# Patient Record
Sex: Female | Born: 1965
Health system: Southern US, Community
[De-identification: ages and names within clinical notes are randomized; demographics above are authoritative.]

## PROBLEM LIST (undated history)

## (undated) DIAGNOSIS — T7840XA Allergy, unspecified, initial encounter: Secondary | ICD-10-CM

## (undated) DIAGNOSIS — A630 Anogenital (venereal) warts: Secondary | ICD-10-CM

## (undated) DIAGNOSIS — F419 Anxiety disorder, unspecified: Secondary | ICD-10-CM

## (undated) DIAGNOSIS — Z9189 Other specified personal risk factors, not elsewhere classified: Secondary | ICD-10-CM

## (undated) DIAGNOSIS — O139 Gestational [pregnancy-induced] hypertension without significant proteinuria, unspecified trimester: Secondary | ICD-10-CM

## (undated) DIAGNOSIS — K279 Peptic ulcer, site unspecified, unspecified as acute or chronic, without hemorrhage or perforation: Secondary | ICD-10-CM

## (undated) HISTORY — PX: SPINE SURGERY: SHX786

## (undated) HISTORY — DX: Gestational (pregnancy-induced) hypertension without significant proteinuria, unspecified trimester: O13.9

## (undated) HISTORY — DX: Other specified personal risk factors, not elsewhere classified: Z91.89

## (undated) HISTORY — DX: Anogenital (venereal) warts: A63.0

## (undated) HISTORY — DX: Allergy, unspecified, initial encounter: T78.40XA

## (undated) HISTORY — DX: Peptic ulcer, site unspecified, unspecified as acute or chronic, without hemorrhage or perforation: K27.9

## (undated) HISTORY — DX: Anxiety disorder, unspecified: F41.9

---

## 1998-08-23 ENCOUNTER — Other Ambulatory Visit: Admission: RE | Admit: 1998-08-23 | Discharge: 1998-08-23 | Payer: Self-pay | Admitting: Obstetrics and Gynecology

## 1998-10-18 ENCOUNTER — Encounter (HOSPITAL_COMMUNITY): Admission: RE | Admit: 1998-10-18 | Discharge: 1999-01-16 | Payer: Self-pay | Admitting: Obstetrics and Gynecology

## 1999-05-27 ENCOUNTER — Encounter: Payer: Self-pay | Admitting: Obstetrics and Gynecology

## 1999-05-27 ENCOUNTER — Ambulatory Visit (HOSPITAL_COMMUNITY): Admission: RE | Admit: 1999-05-27 | Discharge: 1999-05-27 | Payer: Self-pay | Admitting: Obstetrics and Gynecology

## 1999-10-20 ENCOUNTER — Inpatient Hospital Stay (HOSPITAL_COMMUNITY): Admission: AD | Admit: 1999-10-20 | Discharge: 1999-10-22 | Payer: Self-pay | Admitting: Obstetrics and Gynecology

## 1999-12-05 ENCOUNTER — Other Ambulatory Visit: Admission: RE | Admit: 1999-12-05 | Discharge: 1999-12-05 | Payer: Self-pay | Admitting: Obstetrics and Gynecology

## 2001-05-28 ENCOUNTER — Other Ambulatory Visit: Admission: RE | Admit: 2001-05-28 | Discharge: 2001-05-28 | Payer: Self-pay | Admitting: Obstetrics and Gynecology

## 2001-07-11 ENCOUNTER — Encounter: Payer: Self-pay | Admitting: Obstetrics and Gynecology

## 2001-07-11 ENCOUNTER — Ambulatory Visit (HOSPITAL_COMMUNITY): Admission: RE | Admit: 2001-07-11 | Discharge: 2001-07-11 | Payer: Self-pay | Admitting: Family Medicine

## 2001-11-27 ENCOUNTER — Inpatient Hospital Stay (HOSPITAL_COMMUNITY): Admission: AD | Admit: 2001-11-27 | Discharge: 2001-11-29 | Payer: Self-pay | Admitting: Obstetrics and Gynecology

## 2002-06-18 ENCOUNTER — Other Ambulatory Visit: Admission: RE | Admit: 2002-06-18 | Discharge: 2002-06-18 | Payer: Self-pay | Admitting: Obstetrics and Gynecology

## 2003-06-25 ENCOUNTER — Other Ambulatory Visit: Admission: RE | Admit: 2003-06-25 | Discharge: 2003-06-25 | Payer: Self-pay | Admitting: Obstetrics and Gynecology

## 2004-01-10 HISTORY — PX: OTHER SURGICAL HISTORY: SHX169

## 2004-06-23 ENCOUNTER — Other Ambulatory Visit: Admission: RE | Admit: 2004-06-23 | Discharge: 2004-06-23 | Payer: Self-pay | Admitting: Obstetrics and Gynecology

## 2004-07-28 ENCOUNTER — Ambulatory Visit (HOSPITAL_BASED_OUTPATIENT_CLINIC_OR_DEPARTMENT_OTHER): Admission: RE | Admit: 2004-07-28 | Discharge: 2004-07-28 | Payer: Self-pay | Admitting: General Surgery

## 2004-07-28 ENCOUNTER — Ambulatory Visit (HOSPITAL_COMMUNITY): Admission: RE | Admit: 2004-07-28 | Discharge: 2004-07-28 | Payer: Self-pay | Admitting: General Surgery

## 2004-07-28 ENCOUNTER — Encounter (INDEPENDENT_AMBULATORY_CARE_PROVIDER_SITE_OTHER): Payer: Self-pay | Admitting: *Deleted

## 2005-09-21 ENCOUNTER — Other Ambulatory Visit: Admission: RE | Admit: 2005-09-21 | Discharge: 2005-09-21 | Payer: Self-pay | Admitting: Obstetrics and Gynecology

## 2008-07-15 ENCOUNTER — Ambulatory Visit: Payer: Self-pay | Admitting: Internal Medicine

## 2008-07-15 ENCOUNTER — Encounter (INDEPENDENT_AMBULATORY_CARE_PROVIDER_SITE_OTHER): Payer: Self-pay | Admitting: *Deleted

## 2008-07-15 DIAGNOSIS — A630 Anogenital (venereal) warts: Secondary | ICD-10-CM | POA: Insufficient documentation

## 2008-07-15 DIAGNOSIS — Z9189 Other specified personal risk factors, not elsewhere classified: Secondary | ICD-10-CM | POA: Insufficient documentation

## 2008-07-15 DIAGNOSIS — K279 Peptic ulcer, site unspecified, unspecified as acute or chronic, without hemorrhage or perforation: Secondary | ICD-10-CM | POA: Insufficient documentation

## 2008-07-15 DIAGNOSIS — R030 Elevated blood-pressure reading, without diagnosis of hypertension: Secondary | ICD-10-CM | POA: Insufficient documentation

## 2008-07-15 LAB — CONVERTED CEMR LAB
ALT: 14 units/L (ref 0–35)
AST: 21 units/L (ref 0–37)
Alkaline Phosphatase: 49 units/L (ref 39–117)
BUN: 9 mg/dL (ref 6–23)
Basophils Relative: 0.5 % (ref 0.0–3.0)
Bilirubin, Direct: 0.1 mg/dL (ref 0.0–0.3)
Chloride: 105 meq/L (ref 96–112)
Cholesterol: 178 mg/dL (ref 0–200)
Creatinine, Ser: 0.8 mg/dL (ref 0.4–1.2)
Eosinophils Relative: 1.6 % (ref 0.0–5.0)
GFR calc non Af Amer: 84.2 mL/min (ref >60)
LDL Cholesterol: 96 mg/dL (ref 0–99)
Lymphocytes Relative: 14.4 % (ref 12.0–46.0)
Monocytes Relative: 4.7 % (ref 3.0–12.0)
Neutrophils Relative %: 78.8 % — ABNORMAL HIGH (ref 43.0–77.0)
Pap Smear: NORMAL
Platelets: 144 10*3/uL — ABNORMAL LOW (ref 150.0–400.0)
Potassium: 3.8 meq/L (ref 3.5–5.1)
RBC: 4.12 M/uL (ref 3.87–5.11)
Total Bilirubin: 0.7 mg/dL (ref 0.3–1.2)
Total CHOL/HDL Ratio: 2
Total Protein: 7.5 g/dL (ref 6.0–8.3)
VLDL: 9.4 mg/dL (ref 0.0–40.0)
WBC: 6.2 10*3/uL (ref 4.5–10.5)

## 2008-07-17 ENCOUNTER — Encounter (INDEPENDENT_AMBULATORY_CARE_PROVIDER_SITE_OTHER): Payer: Self-pay | Admitting: *Deleted

## 2008-07-17 LAB — CONVERTED CEMR LAB: Vit D, 25-Hydroxy: 28 ng/mL — ABNORMAL LOW (ref 30–89)

## 2008-08-06 ENCOUNTER — Encounter: Admission: RE | Admit: 2008-08-06 | Discharge: 2008-08-06 | Payer: Self-pay | Admitting: Obstetrics and Gynecology

## 2008-08-12 ENCOUNTER — Encounter: Admission: RE | Admit: 2008-08-12 | Discharge: 2008-08-12 | Payer: Self-pay | Admitting: Obstetrics and Gynecology

## 2008-09-29 ENCOUNTER — Ambulatory Visit: Payer: Self-pay | Admitting: Internal Medicine

## 2009-03-03 ENCOUNTER — Encounter: Admission: RE | Admit: 2009-03-03 | Discharge: 2009-03-03 | Payer: Self-pay | Admitting: Obstetrics and Gynecology

## 2010-05-27 NOTE — Discharge Summary (Signed)
[facility]  Patient:    Jill Gay, Jill Gay                  MRN: [id number] Adm. Date:  [id number] Disc. Date: [id number] Attending:  Oliver Pila                           Discharge Summary                                This is a 45 year old white female para 0-0-1-0, gravida 2 at 60 1/[redacted] weeks gestation, [facility] October 26, 1999 by last period and first trimester sonogram.  Presented for induction of labor given borderline blood pressure.  No symptoms or signs of PIH.  Favorable cervix.  Office blood pressure 130-160s/90s.  Negative proteinuria.  PRENATAL LABORATORIES:        Blood group and type O+ with negative antibody. RPR nonreactive.  Rubella immune.  Hepatitis B surface antigen negative.  HIV negative.  GC and chlamydia negative.  Group B strep negative.  One hour glucola 83.  PAST OBSTETRICAL HISTORY:     In October 2000 spontaneous abortion.  PAST GYNECOLOGICAL HISTORY:   History of cryo surgery 10-15 years ago.  PAST MEDICAL HISTORY:         Remote history of ulcers.  PAST SURGICAL HISTORY:        Negative.  ALLERGIES:                    No known drug allergies.  MEDICATIONS:                  Prenatal vitamins.  PHYSICAL EXAMINATION:  VITAL SIGNS:                  On admission blood pressure 135/89.  Vital signs otherwise normal.  Fetal heart tones were reactive.  There were no contractions.  HEART:                        Normal size and sounds.  No murmurs.  LUNGS:                        Clear.  ABDOMEN:                      Soft and nontender.  Cervix was 1 cm, 90% effaced, vertex at a -1 station.                                Artificial rupture of the membranes produced clear fluid.  The patient was begun on Pitocin.  She received an epidural.  By 1 p.m. she was 9 cm dilated.  By 3 p.m. she had been pushing since 2 p.m.  She pushed for two hours and 45 minutes and progressed to +2 station in the LOA position.   Because of maternal exhaustion and little progress for one hour, low forceps delivery was done by Dr. Senaida Ores.  The baby delivered in two pulls over a second degree laceration.  Apgars were 8 and 9 at one and five minutes.  Weight 7 pounds 7 ounces.  Placenta delivered intact.  Three vessel cord.  Left sulcus laceration repaired with 2-0 Vicryl and the second  degree perineal laceration repaired with 2-0 Vicryl.  Cervix and rectum were intact. Blood loss about 450 cc.  Postpartum the patient did quite well and was discharged on the second postpartum day.  PIH laboratories done on admission were completely normal.  Uric acid was 4.0, LDH 192, SGOT 23, SGPT 16, BUN 9. On admission the RPR was nonreactive.  Hemoglobin 12.5, hematocrit 36.3, white count 11,400, platelet count 161,000.  Follow-up hemoglobin 10.7, hematocrit 30.0, platelet count 169,000.  FINAL DIAGNOSES:              1. Intrauterine pregnancy 39 weeks, delivered,                                  LOA.                               2. Pregnancy induced high blood pressure.  OPERATION:                    Low forceps vaginal delivery.  Repair of perineal and vaginal lacerations.  FINAL CONDITION:              Improved.  INSTRUCTIONS:                 Regular discharge instruction booklet.  Patient was given a prescription for Percocet 325/5 to take every four to six hours as needed for pain and Motrin 600 mg one every eight hours as needed.  She is to return in six weeks for follow-up examination.DD:  10/22/99 TD:  10/23/99 Job: [id number]

## 2010-05-27 NOTE — Op Note (Signed)
NAMECAMBER, NINH               ACCOUNT NO.:  [id number]   MEDICAL RECORD NO.:  [id number]          PATIENT TYPE:  AMB   LOCATION:  [facility]                         FACILITY:  [facility]   PHYSICIAN:  Adolph Pollack, M.D.DATE OF BIRTH:  May 11, 1965   DATE OF PROCEDURE:  07/28/2004  DATE OF DISCHARGE:                                 OPERATIVE REPORT   PREOPERATIVE DIAGNOSIS:  Right groin soft tissue mass.   POSTOPERATIVE DIAGNOSIS:  Right groin soft tissue mass (2 cm).   PROCEDURE:  Excision of right groin soft tissue mass.   SURGEON:  Adolph Pollack, M.D.   ANESTHESIA:  MAC with local (1% lidocaine with epinephrine plus 0.5% plain  Marcaine).   INDICATIONS:  Ms. Iglehart is a 45 year old female whose noted a mass in her  right groin that has been slowly growing over time. It causes her  intermittent discomfort. She now presents for excision of it. The procedure  and the risks were discussed with her preoperatively.   TECHNIQUE:  She is seen in the holding area and the right groin marked with  my initials. She is then brought to the operating room, placed supine on the  operating table and given some intravenous sedation. A circle is then placed  around the right groin mass and the right groin area was sterilely prepped  and draped. Local anesthetic was infiltrated directly over the mass in the  right groin area. An incision was made directly over the mass through the  skin and subcutaneous tissue. The mass was deep to Scarpa's fascia which was  divided. I palpated a soft tissue mass which appeared to be fairly firm. It  was slightly fixed to the external oblique aponeurosis but was able to be  dissected away from this fairly easily bluntly. The mass was then excised  and sent to pathology.   I placed a 2-0 Prolene suture directly at the area where the mass was  attached to the external oblique aponeurosis just in case I needed to re-  explore this area further. I subsequently  injected local anesthetic into the  bed of the excision cavity. The subcutaneous tissue was then closed with  running 4-0 Monocryl suture and the skin was closed  with a running 4-0 Monocryl subcuticular stitch. Steri-Strips and sterile  dressing were applied. She tolerated the procedure without any apparent  complications and was taken to recovery in satisfactory condition. She will  be given postop instructions and follow-up in about two weeks.       TJR/MEDQ  D:  07/28/2004  T:  07/28/2004  Job:  [id number]   cc:   Huel Cote, M.D.  [address] [place], [address]  [place], [place] [address]  Fax: [phone]

## 2010-05-27 NOTE — Discharge Summary (Signed)
Jill Gay, Jill Gay                           ACCOUNT NO.:  [id number]   MEDICAL RECORD NO.:  [id number]                   PATIENT TYPE:  INP   LOCATION:  9133                                 FACILITY:  [facility]   PHYSICIAN:  Huel Cote, M.D.              DATE OF BIRTH:  11-04-1965   DATE OF ADMISSION:  11/27/2001  DATE OF DISCHARGE:  11/29/2001                                 DISCHARGE SUMMARY   DISCHARGE DIAGNOSES:  1. Term pregnancy at 70 and 4/7 weeks delivered.  2. Status post normal spontaneous vaginal delivery.   DISCHARGE MEDICATIONS:  1. Motrin 600 mg p.o. q.6h.  2. Percocet one to two tablets p.o. q.4h. p.r.n.   DISCHARGE FOLLOWUP:  The patient is to follow up in six weeks for her  routine postpartum examination.   HOSPITAL COURSE:  The patient is a 45 year old G3, P1-0-1-1 who is admitted  at 89 and 4/7 weeks with a complaint of irregular contractions and bloody  show.  Prenatal care had been uncomplicated except for advanced cervical  dilation the last few weeks of pregnancy to 3-4 cm.   PRENATAL LABORATORIES:  O+.  Antibody negative.  RPR nonreactive.  Rubella  immune.  Hepatitis B surface antigen negative.  HIV declined.  GC negative.  Chlamydia negative.  Triple screen negative.  GBS negative.  One hour  Glucola 133.   PAST OB HISTORY:  The patient had a low forceps delivery 7 pound 7 ounce  infant in 2001.   PAST GYN HISTORY:  No abnormal Pap smears.   PAST MEDICAL HISTORY:  Remote history of ulcers.   PAST SURGICAL HISTORY:  None.   ALLERGIES:  None.   MEDICATIONS:  None.   PHYSICAL EXAMINATION:  VITAL SIGNS:  On admission she was afebrile with  stable vital signs.  Fetal heart rate was reactive.  ABDOMEN:  She was gravid and nontender with estimated fetal weight of 7.5-8  pounds.  PELVIC:  Cervix was 90% effaced, 5 cm dilated, and -1 station.   She had rupture of membranes performed with clear fluid obtained and was  augmented with Pitocin and  reached complete dilation over the next several  hours.  Pushed well with a normal spontaneous vaginal delivery of a vigorous  female infant over a first degree vaginal laceration.  Apgars were 8, 8, and  9.  Weight was 7 pounds 10 ounces.  Placenta delivered spontaneously.  There  was a first degree laceration repaired with 2-0 Vicryl for  hemostasis.  Estimated blood loss was 350 cc.  The patient did very well  postpartum and on postpartum day number two was afebrile with stable vital  signs and no complaints.  Therefore, she was felt stable for discharge home  and was discharged with medications and follow-up as previously stated.  Huel Cote, M.D.    KR/MEDQ  D:  11/29/2001  T:  11/29/2001  Job:  [id number]

## 2010-11-02 ENCOUNTER — Telehealth: Payer: Self-pay | Admitting: *Deleted

## 2010-11-02 DIAGNOSIS — Z0000 Encounter for general adult medical examination without abnormal findings: Secondary | ICD-10-CM

## 2010-11-02 NOTE — Telephone Encounter (Signed)
Received staff msg pt made cpx for 11/16/10. Need labs put in epic...11/02/10@4 :20pm/LMB

## 2010-11-09 ENCOUNTER — Encounter: Payer: Self-pay | Admitting: Internal Medicine

## 2010-11-16 ENCOUNTER — Ambulatory Visit (INDEPENDENT_AMBULATORY_CARE_PROVIDER_SITE_OTHER): Payer: Managed Care, Other (non HMO) | Admitting: Internal Medicine

## 2010-11-16 ENCOUNTER — Encounter: Payer: Self-pay | Admitting: Internal Medicine

## 2010-11-16 ENCOUNTER — Other Ambulatory Visit (INDEPENDENT_AMBULATORY_CARE_PROVIDER_SITE_OTHER): Payer: Managed Care, Other (non HMO)

## 2010-11-16 VITALS — BP 112/78 | HR 79 | Temp 98.0°F | Ht 66.0 in | Wt 132.0 lb

## 2010-11-16 DIAGNOSIS — Z23 Encounter for immunization: Secondary | ICD-10-CM

## 2010-11-16 DIAGNOSIS — Z0000 Encounter for general adult medical examination without abnormal findings: Secondary | ICD-10-CM

## 2010-11-16 LAB — HEPATIC FUNCTION PANEL
ALT: 11 U/L (ref 0–35)
Albumin: 4.8 g/dL (ref 3.5–5.2)
Alkaline Phosphatase: 52 U/L (ref 39–117)
Bilirubin, Direct: 0.2 mg/dL (ref 0.0–0.3)
Total Protein: 8 g/dL (ref 6.0–8.3)

## 2010-11-16 LAB — CBC WITH DIFFERENTIAL/PLATELET
Basophils Absolute: 0 10*3/uL (ref 0.0–0.1)
Basophils Relative: 0.5 % (ref 0.0–3.0)
Eosinophils Absolute: 0.1 10*3/uL (ref 0.0–0.7)
Hemoglobin: 13.7 g/dL (ref 12.0–15.0)
Lymphs Abs: 1 10*3/uL (ref 0.7–4.0)
MCHC: 34.6 g/dL (ref 30.0–36.0)
MCV: 96.4 fl (ref 78.0–100.0)
Monocytes Absolute: 0.3 10*3/uL (ref 0.1–1.0)
Neutro Abs: 3.3 10*3/uL (ref 1.4–7.7)
RBC: 4.12 Mil/uL (ref 3.87–5.11)
RDW: 12.2 % (ref 11.5–14.6)

## 2010-11-16 LAB — BASIC METABOLIC PANEL
CO2: 28 mEq/L (ref 19–32)
Chloride: 106 mEq/L (ref 96–112)
Glucose, Bld: 100 mg/dL — ABNORMAL HIGH (ref 70–99)
Sodium: 141 mEq/L (ref 135–145)

## 2010-11-16 LAB — URINALYSIS
Ketones, ur: NEGATIVE
Leukocytes, UA: NEGATIVE
Nitrite: NEGATIVE
Specific Gravity, Urine: 1.015 (ref 1.000–1.030)
Urobilinogen, UA: 0.2 (ref 0.0–1.0)
pH: 6.5 (ref 5.0–8.0)

## 2010-11-16 LAB — LIPID PANEL
HDL: 68.5 mg/dL (ref >39.00)
Total CHOL/HDL Ratio: 3
Triglycerides: 59 mg/dL (ref 0.0–149.0)

## 2010-11-16 NOTE — Patient Instructions (Addendum)
It was good to see you today. Exam and EKG look great! Keep up the good work! Test(s) ordered today. Your results will be called to you after review (48-72hours after test completion). If any changes need to be made, you will be notified at that time. Health Maintenance reviewed - tetanus booster with diptheria and pertussis given, everything else up to date. Please schedule followup annually for physical and labs, call sooner if problems.

## 2010-11-16 NOTE — Progress Notes (Signed)
Subjective:    Patient ID: Jill Gay, female    DOB: 06-01-1965, 45 y.o.   MRN: [id number]  HPI  patient is here today for annual physical. Patient feels well and has no complaints.  Past Medical History  Diagnosis Date  . CHICKENPOX, HX OF   . HYPERTENSION     "white coat"  . VENEREAL WART   . PUD (peptic ulcer disease)    Family History  Problem Relation Age of Onset  . Arthritis Other   . Alcohol abuse Other   . Heart disease Other     Grandparent  . Stroke Other     grandparent   History  Substance Use Topics  . Smoking status: Former Games developer  . Smokeless tobacco: Not on file  . Alcohol Use: No     Review of Systems Constitutional: Negative for fever or weight change.  Respiratory: Negative for cough and shortness of breath.   Cardiovascular: Negative for chest pain or palpitations.  Gastrointestinal: Negative for abdominal pain, no bowel changes.  Musculoskeletal: Negative for gait problem or joint swelling.  Skin: Negative for rash.  Neurological: Negative for dizziness or headache.  No other specific complaints in a complete review of systems (except as listed in HPI above).     Objective:   Physical Exam BP 112/78  Pulse 79  Temp(Src) 98 F (36.7 C) (Oral)  Ht 5\' 6"  (1.676 m)  Wt 132 lb (59.875 kg)  BMI 21.31 kg/m2  SpO2 98% Wt Readings from Last 3 Encounters:  11/16/10 132 lb (59.875 kg)  09/29/08 136 lb 9.6 oz (61.961 kg)  07/15/08 139 lb 6.4 oz (63.231 kg)   Constitutional: She appears well-developed and well-nourished. No distress.  HENT: Head: Normocephalic and atraumatic. Ears: B TMs ok, no erythema or effusion; Nose: Nose normal.  Mouth/Throat: Oropharynx is clear and moist. No oropharyngeal exudate.  Eyes: Conjunctivae and EOM are normal. Pupils are equal, round, and reactive to light. No scleral icterus.  Neck: Normal range of motion. Neck supple. No JVD present. No thyromegaly present.  Cardiovascular: Normal rate, regular rhythm  and normal heart sounds.  No murmur heard. No BLE edema. Pulmonary/Chest: Effort normal and breath sounds normal. No respiratory distress. She has no wheezes.  Abdominal: Soft. Bowel sounds are normal. She exhibits no distension. There is no tenderness. no masses Musculoskeletal: Normal range of motion, no joint effusions. No gross deformities Neurological: She is alert and oriented to person, place, and time. No cranial nerve deficit. Coordination normal.  Skin: Skin is warm and dry. No rash noted. No erythema.  Psychiatric: She has a normal mood and affect. Her behavior is normal. Judgment and thought content normal.   Lab Results  Component Value Date   WBC 6.2 07/15/2008   HGB 13.6 07/15/2008   HCT 39.3 07/15/2008   PLT 144.0* 07/15/2008   GLUCOSE 99 07/15/2008   CHOL 178 07/15/2008   TRIG 47.0 07/15/2008   HDL 72.60 07/15/2008   LDLCALC 96 07/15/2008   ALT 14 07/15/2008   AST 21 07/15/2008   NA 142 07/15/2008   K 3.8 07/15/2008   CL 105 07/15/2008   CREATININE 0.8 07/15/2008   BUN 9 07/15/2008   CO2 30 07/15/2008   TSH 2.24 07/15/2008    EKG: sinus @ 74 bpm, no ischemic changes     Assessment & Plan:  CPX - v70.0 - Patient has been counseled on age-appropriate routine health concerns for screening and prevention. These are reviewed and up-to-date. Immunizations are  up-to-date or declined. Labs and ECG reviewed.

## 2011-01-13 ENCOUNTER — Other Ambulatory Visit: Payer: Self-pay | Admitting: Obstetrics and Gynecology

## 2011-01-13 DIAGNOSIS — Z1231 Encounter for screening mammogram for malignant neoplasm of breast: Secondary | ICD-10-CM

## 2011-01-30 ENCOUNTER — Ambulatory Visit
Admission: RE | Admit: 2011-01-30 | Discharge: 2011-01-30 | Disposition: A | Discharge status: Home / Self Care | Payer: Commercial Indemnity | Source: Ambulatory Visit | Attending: Obstetrics and Gynecology | Admitting: Obstetrics and Gynecology

## 2011-01-30 DIAGNOSIS — Z1231 Encounter for screening mammogram for malignant neoplasm of breast: Secondary | ICD-10-CM

## 2013-07-30 ENCOUNTER — Other Ambulatory Visit: Payer: Self-pay

## 2013-07-30 DIAGNOSIS — Z1231 Encounter for screening mammogram for malignant neoplasm of breast: Secondary | ICD-10-CM

## 2013-08-13 ENCOUNTER — Ambulatory Visit
Admission: RE | Admit: 2013-08-13 | Discharge: 2013-08-13 | Disposition: A | Discharge status: Home / Self Care | Payer: 59 | Source: Ambulatory Visit

## 2013-08-13 DIAGNOSIS — Z1231 Encounter for screening mammogram for malignant neoplasm of breast: Secondary | ICD-10-CM

## 2013-10-08 ENCOUNTER — Other Ambulatory Visit (INDEPENDENT_AMBULATORY_CARE_PROVIDER_SITE_OTHER): Payer: 59

## 2013-10-08 ENCOUNTER — Encounter: Payer: Self-pay | Admitting: Internal Medicine

## 2013-10-08 ENCOUNTER — Ambulatory Visit (INDEPENDENT_AMBULATORY_CARE_PROVIDER_SITE_OTHER): Payer: 59 | Admitting: Internal Medicine

## 2013-10-08 VITALS — BP 118/72 | HR 66 | Temp 98.0°F | Resp 18 | Ht 64.0 in | Wt 133.0 lb

## 2013-10-08 DIAGNOSIS — Z13220 Encounter for screening for lipoid disorders: Secondary | ICD-10-CM

## 2013-10-08 DIAGNOSIS — Z23 Encounter for immunization: Secondary | ICD-10-CM

## 2013-10-08 DIAGNOSIS — R030 Elevated blood-pressure reading, without diagnosis of hypertension: Secondary | ICD-10-CM

## 2013-10-08 DIAGNOSIS — Z0000 Encounter for general adult medical examination without abnormal findings: Secondary | ICD-10-CM

## 2013-10-08 LAB — LIPID PANEL
CHOL/HDL RATIO: 3
Cholesterol: 196 mg/dL (ref 0–200)
HDL: 77.3 mg/dL (ref >39.00)
LDL CALC: 110 mg/dL — AB (ref 0–99)
NONHDL: 118.7
Triglycerides: 45 mg/dL (ref 0.0–149.0)
VLDL: 9 mg/dL (ref 0.0–40.0)

## 2013-10-08 LAB — BASIC METABOLIC PANEL
BUN: 15 mg/dL (ref 6–23)
CALCIUM: 9.6 mg/dL (ref 8.4–10.5)
CO2: 27 meq/L (ref 19–32)
CREATININE: 0.7 mg/dL (ref 0.4–1.2)
Chloride: 105 mEq/L (ref 96–112)
GFR: 89.88 mL/min (ref >60.00)
GLUCOSE: 97 mg/dL (ref 70–99)
Potassium: 4.4 mEq/L (ref 3.5–5.1)
SODIUM: 140 meq/L (ref 135–145)

## 2013-10-08 NOTE — Patient Instructions (Signed)
We will check your cholesterol and kidneys today to make sure they are still normal.  You are doing great!   Come back in about 1-2 years or sooner if you are sick or have new problems.  Exercise to Stay Healthy Exercise helps you become and stay healthy. EXERCISE IDEAS AND TIPS Choose exercises that:  You enjoy.  Fit into your day. You do not need to exercise really hard to be healthy. You can do exercises at a slow or medium level and stay healthy. You can:  Stretch before and after working out.  Try yoga, Pilates, or tai chi.  Lift weights.  Walk fast, swim, jog, run, climb stairs, bicycle, dance, or rollerskate.  Take aerobic classes. Exercises that burn about 150 calories:  Running 1  miles in 15 minutes.  Playing volleyball for 45 to 60 minutes.  Washing and waxing a car for 45 to 60 minutes.  Playing touch football for 45 minutes.  Walking 1  miles in 35 minutes.  Pushing a stroller 1  miles in 30 minutes.  Playing basketball for 30 minutes.  Raking leaves for 30 minutes.  Bicycling 5 miles in 30 minutes.  Walking 2 miles in 30 minutes.  Dancing for 30 minutes.  Shoveling snow for 15 minutes.  Swimming laps for 20 minutes.  Walking up stairs for 15 minutes.  Bicycling 4 miles in 15 minutes.  Gardening for 30 to 45 minutes.  Jumping rope for 15 minutes.  Washing windows or floors for 45 to 60 minutes. Document Released: 01/28/2010 Document Revised: 03/20/2011 Document Reviewed: 01/28/2010 [facility] Patient Information 2015 [place], [place]. This information is not intended to replace advice given to you by your health care provider. Make sure you discuss any questions you have with your health care provider.

## 2013-10-08 NOTE — Progress Notes (Signed)
Pre visit review using our clinic review tool, if applicable. No additional management support is needed unless otherwise documented below in the visit note. 

## 2013-10-09 DIAGNOSIS — Z0000 Encounter for general adult medical examination without abnormal findings: Secondary | ICD-10-CM | POA: Insufficient documentation

## 2013-10-09 NOTE — Assessment & Plan Note (Signed)
BP normal today and not on medication. Will continue to monitor.

## 2013-10-09 NOTE — Assessment & Plan Note (Signed)
Patient reports recent pap smear (within 1 year) normal. She will get flu shot.

## 2013-10-09 NOTE — Progress Notes (Signed)
   Subjective:    Patient ID: Jill Gay, female    DOB: [birth year], 48 y.o.   MRN: [id number]  HPI The patient is a 48 YO woman who is coming in today for routine care and insurance screening. She is not having any problems. She does not always exercise like she should but is trying to walk more. She is not taking any medicines and she tries to eat healthy although this is not always possible.   Review of Systems  Constitutional: Negative for fever, activity change, appetite change and fatigue.  HENT: Negative.   Eyes: Negative.   Respiratory: Negative for cough, chest tightness, shortness of breath and wheezing.   Cardiovascular: Negative for chest pain, palpitations and leg swelling.  Gastrointestinal: Negative for abdominal pain, diarrhea, constipation and abdominal distention.  Genitourinary: Negative.   Musculoskeletal: Negative for arthralgias, back pain, gait problem and myalgias.  Skin: Negative.   Allergic/Immunologic: Negative.   Neurological: Negative.   Psychiatric/Behavioral: Negative.       Objective:   Physical Exam  Constitutional: She is oriented to person, place, and time. She appears well-developed and well-nourished. No distress.  HENT:  Head: Normocephalic and atraumatic.  Eyes: EOM are normal.  Neck: Normal range of motion.  Cardiovascular: Normal rate and regular rhythm.   No murmur heard. Pulmonary/Chest: Effort normal and breath sounds normal. No respiratory distress. She has no wheezes. She has no rales.  Abdominal: Soft. Bowel sounds are normal. She exhibits no distension. There is no tenderness. There is no rebound.  Musculoskeletal: Normal range of motion.  Neurological: She is alert and oriented to person, place, and time. No cranial nerve deficit.  Skin: Skin is warm and dry.   Filed Vitals:   10/08/13 1108  BP: 118/72  Pulse: 66  Temp: 98 F (36.7 C)  TempSrc: Oral  Resp: 18  Height: 5\' 4"  (1.626 m)  Weight: 133 lb (60.328 kg)  SpO2:  97%      Assessment & Plan:

## 2013-10-13 ENCOUNTER — Telehealth: Payer: Self-pay | Admitting: Internal Medicine

## 2013-10-13 NOTE — Telephone Encounter (Signed)
Patient is returning your call.  

## 2014-10-13 ENCOUNTER — Telehealth: Payer: Self-pay | Admitting: Internal Medicine

## 2014-10-13 NOTE — Telephone Encounter (Signed)
Pt would like to transfer care from dr Hillard Danker to Nicki Reaper She needs to get a health care assessment for work done (labs and cpx) Is this ok

## 2014-10-13 NOTE — Telephone Encounter (Signed)
Fine with me

## 2014-10-14 NOTE — Telephone Encounter (Signed)
Left message asking pt to call office  °

## 2014-10-14 NOTE — Telephone Encounter (Signed)
Appointment 10/10 Pt aware

## 2014-10-19 ENCOUNTER — Other Ambulatory Visit: Payer: Self-pay | Admitting: Internal Medicine

## 2014-10-19 ENCOUNTER — Encounter: Payer: Self-pay | Admitting: Internal Medicine

## 2014-10-19 ENCOUNTER — Ambulatory Visit (INDEPENDENT_AMBULATORY_CARE_PROVIDER_SITE_OTHER): Payer: 59 | Admitting: Internal Medicine

## 2014-10-19 VITALS — BP 134/84 | HR 74 | Temp 97.8°F | Ht 64.0 in | Wt 123.5 lb

## 2014-10-19 DIAGNOSIS — Z0000 Encounter for general adult medical examination without abnormal findings: Secondary | ICD-10-CM | POA: Diagnosis not present

## 2014-10-19 DIAGNOSIS — D72819 Decreased white blood cell count, unspecified: Secondary | ICD-10-CM

## 2014-10-19 DIAGNOSIS — Z1239 Encounter for other screening for malignant neoplasm of breast: Secondary | ICD-10-CM | POA: Diagnosis not present

## 2014-10-19 DIAGNOSIS — O139 Gestational [pregnancy-induced] hypertension without significant proteinuria, unspecified trimester: Secondary | ICD-10-CM

## 2014-10-19 DIAGNOSIS — K279 Peptic ulcer, site unspecified, unspecified as acute or chronic, without hemorrhage or perforation: Secondary | ICD-10-CM | POA: Diagnosis not present

## 2014-10-19 LAB — COMPREHENSIVE METABOLIC PANEL
ALK PHOS: 38 U/L — AB (ref 39–117)
ALT: 11 U/L (ref 0–35)
AST: 15 U/L (ref 0–37)
Albumin: 4.5 g/dL (ref 3.5–5.2)
BILIRUBIN TOTAL: 0.5 mg/dL (ref 0.2–1.2)
BUN: 9 mg/dL (ref 6–23)
CO2: 31 mEq/L (ref 19–32)
CREATININE: 0.78 mg/dL (ref 0.40–1.20)
Calcium: 9.2 mg/dL (ref 8.4–10.5)
Chloride: 106 mEq/L (ref 96–112)
GFR: 84.2 mL/min (ref >60.00)
GLUCOSE: 97 mg/dL (ref 70–99)
POTASSIUM: 4.1 meq/L (ref 3.5–5.1)
SODIUM: 143 meq/L (ref 135–145)
TOTAL PROTEIN: 7 g/dL (ref 6.0–8.3)

## 2014-10-19 LAB — LIPID PANEL
Cholesterol: 166 mg/dL (ref 0–200)
HDL: 69.1 mg/dL (ref >39.00)
LDL Cholesterol: 79 mg/dL (ref 0–99)
NONHDL: 96.98
Total CHOL/HDL Ratio: 2
Triglycerides: 92 mg/dL (ref 0.0–149.0)
VLDL: 18.4 mg/dL (ref 0.0–40.0)

## 2014-10-19 LAB — CBC
HCT: 40.3 % (ref 36.0–46.0)
Hemoglobin: 13.5 g/dL (ref 12.0–15.0)
MCHC: 33.5 g/dL (ref 30.0–36.0)
MCV: 97.1 fl (ref 78.0–100.0)
Platelets: 161 10*3/uL (ref 150.0–400.0)
RBC: 4.15 Mil/uL (ref 3.87–5.11)
RDW: 12.1 % (ref 11.5–15.5)
WBC: 3.1 10*3/uL — AB (ref 4.0–10.5)

## 2014-10-19 NOTE — Progress Notes (Signed)
HPI  Pt presents to the clinic today to establish care and for management of the conditions listed below. She is transferring care from Dr. Okey Dupre at [facility]. She would like a physical today if she can get one.  HTN: This was an issue only when she was pregnant. Her BP today is 134/84.  PUD: History of 20 years ago, no reoccurance.  Flu: 09/2014 Tetanus: 2012 Pap Smear: 2015 at [facility] Mammogram: 08/2013, done at [facility] in [place] Vision Screening: 06/2014, yearly Dentist: yearly  Diet: She consumes a THM, low carb, low sugars. Exercise: She likes to run, but has not run in 3 weeks.  Past Medical History  Diagnosis Date  . CHICKENPOX, HX OF   . HYPERTENSION     "white coat"  . VENEREAL WART   . PUD (peptic ulcer disease)     No current outpatient prescriptions on file.   No current facility-administered medications for this visit.    No Known Allergies  Family History  Problem Relation Age of Onset  . Arthritis Other   . Alcohol abuse Other   . Heart disease Other     Grandparent  . Stroke Other     grandparent  . Melanoma Sister     Social History   Social History  . Marital Status: Single    Spouse Name: N/A  . Number of Children: N/A  . Years of Education: N/A   Occupational History  . Not on file.   Social History Main Topics  . Smoking status: Former Games developer  . Smokeless tobacco: Not on file  . Alcohol Use: No  . Drug Use: No  . Sexual Activity: Not on file   Other Topics Concern  . Not on file   Social History Narrative    ROS:  Constitutional: Denies fever, malaise, fatigue, headache or abrupt weight changes.  HEENT: Denies eye pain, eye redness, ear pain, ringing in the ears, wax buildup, runny nose, nasal congestion, bloody nose, or sore throat. Respiratory: Denies difficulty breathing, shortness of breath, cough or sputum production.   Cardiovascular: Denies chest pain, chest tightness, palpitations or swelling in the  hands or feet.  Gastrointestinal: Denies abdominal pain, bloating, constipation, diarrhea or blood in the stool.  GU: Pt reports urge incontinence. Denies frequency, urgency, pain with urination, blood in urine, odor or discharge. Musculoskeletal: Denies decrease in range of motion, difficulty with gait, muscle pain or joint pain and swelling.  Skin: Denies redness, rashes, lesions or ulcercations.  Neurological: Denies dizziness, difficulty with memory, difficulty with speech or problems with balance and coordination.  Psych: Denies anxiety, depression, SI/HI.  No other specific complaints in a complete review of systems (except as listed in HPI above).  PE:  BP 134/84 mmHg  Pulse 74  Temp(Src) 97.8 F (36.6 C) (Oral)  Ht  (1.626 m)  Wt 123 lb 8 oz (56.019 kg)  BMI 21.19 kg/m2  SpO2 99%  LMP 10/03/2014  Wt Readings from Last 3 Encounters:  10/19/14 123 lb 8 oz (56.019 kg)  10/08/13 133 lb (60.328 kg)  11/16/10 132 lb (59.875 kg)    General: Appears her stated age, well developed, well nourished in NAD. HEENT: Head: normal shape and size; Eyes: sclera white, no icterus, conjunctiva pink, PERRLA and EOMs intact; Ears: Tm's gray and intact, normal light reflex;Throat/Mouth: Teeth present, mucosa pink and moist, no lesions or ulcerations noted.  Neck: Neck supple, trachea midline. No masses, lumps or thyromegaly present.  Cardiovascular: Normal rate  and rhythm. S1,S2 noted.  No murmur, rubs or gallops noted. No JVD or BLE edema. No carotid bruits noted. Pulmonary/Chest: Normal effort and positive vesicular breath sounds. No respiratory distress. No wheezes, rales or ronchi noted.  Abdomen: Soft and nontender. Normal bowel sounds. No distention or masses noted.  Musculoskeletal: Strength 5/5 BUE/BLE. No signs of joint swelling. No difficulty with gait.  Neurological: Alert and oriented. Cranial nerves II-XII grossly intact. Coordination normal.  Psychiatric: Mood and affect  normal. Behavior is normal. Judgment and thought content normal.    BMET    Component Value Date/Time   NA 140 10/08/2013 1137   K 4.4 10/08/2013 1137   CL 105 10/08/2013 1137   CO2 27 10/08/2013 1137   GLUCOSE 97 10/08/2013 1137   BUN 15 10/08/2013 1137   CREATININE 0.7 10/08/2013 1137   CALCIUM 9.6 10/08/2013 1137   GFRNONAA 84.20 07/15/2008 0846    Lipid Panel     Component Value Date/Time   CHOL 196 10/08/2013 1137   TRIG 45.0 10/08/2013 1137   HDL 77.30 10/08/2013 1137   CHOLHDL 3 10/08/2013 1137   VLDL 9.0 10/08/2013 1137   LDLCALC 110* 10/08/2013 1137    CBC    Component Value Date/Time   WBC 4.7 11/16/2010 0842   RBC 4.12 11/16/2010 0842   HGB 13.7 11/16/2010 0842   HCT 39.7 11/16/2010 0842   PLT 177.0 11/16/2010 0842   MCV 96.4 11/16/2010 0842   MCHC 34.6 11/16/2010 0842   RDW 12.2 11/16/2010 0842   LYMPHSABS 1.0 11/16/2010 0842   MONOABS 0.3 11/16/2010 0842   EOSABS 0.1 11/16/2010 0842   BASOSABS 0.0 11/16/2010 0842    Hgb A1C No results found for: HGBA1C   Assessment and Plan:  Preventative Health Maintenance:  Flu and Tetanus UTD Pap Smear due 2018 Mammogram ordered for [place]- she will call to schedule Encouraged her to continue to consume a healthy diet and exercise regimen Encouraged her to see an eye doctor and dentist annually Will check CBC, CMET and Lipid Profile today  RTC in 1 year for your annual exam

## 2014-10-19 NOTE — Assessment & Plan Note (Signed)
20 years ago, no reoccurance

## 2014-10-19 NOTE — Patient Instructions (Signed)
Health Maintenance, Female Adopting a healthy lifestyle and getting preventive care can go a long way to promote health and wellness. Talk with your health care provider about what schedule of regular examinations is right for you. This is a good chance for you to check in with your provider about disease prevention and staying healthy. In between checkups, there are plenty of things you can do on your own. Experts have done a lot of research about which lifestyle changes and preventive measures are most likely to keep you healthy. Ask your health care provider for more information. WEIGHT AND DIET  Eat a healthy diet  Be sure to include plenty of vegetables, fruits, low-fat dairy products, and lean protein.  Do not eat a lot of foods high in solid fats, added sugars, or salt.  Get regular exercise. This is one of the most important things you can do for your health.  Most adults should exercise for at least 150 minutes each week. The exercise should increase your heart rate and make you sweat (moderate-intensity exercise).  Most adults should also do strengthening exercises at least twice a week. This is in addition to the moderate-intensity exercise.  Maintain a healthy weight  Body mass index (BMI) is a measurement that can be used to identify possible weight problems. It estimates body fat based on height and weight. Your health care provider can help determine your BMI and help you achieve or maintain a healthy weight.  For females 20 years of age and older:   A BMI below 18.5 is considered underweight.  A BMI of 18.5 to 24.9 is normal.  A BMI of 25 to 29.9 is considered overweight.  A BMI of 30 and above is considered obese.  Watch levels of cholesterol and blood lipids  You should start having your blood tested for lipids and cholesterol at 47 years of age, then have this test every 5 years.  You may need to have your cholesterol levels checked more often if:  Your lipid  or cholesterol levels are high.  You are older than 47 years of age.  You are at high risk for heart disease.  CANCER SCREENING   Lung Cancer  Lung cancer screening is recommended for adults 55-80 years old who are at high risk for lung cancer because of a history of smoking.  A yearly low-dose CT scan of the lungs is recommended for people who:  Currently smoke.  Have quit within the past 15 years.  Have at least a 30-pack-year history of smoking. A pack year is smoking an average of one pack of cigarettes a day for 1 year.  Yearly screening should continue until it has been 15 years since you quit.  Yearly screening should stop if you develop a health problem that would prevent you from having lung cancer treatment.  Breast Cancer  Practice breast self-awareness. This means understanding how your breasts normally appear and feel.  It also means doing regular breast self-exams. Let your health care provider know about any changes, no matter how small.  If you are in your 20s or 30s, you should have a clinical breast exam (CBE) by a health care provider every 1-3 years as part of a regular health exam.  If you are 40 or older, have a CBE every year. Also consider having a breast X-ray (mammogram) every year.  If you have a family history of breast cancer, talk to your health care provider about genetic screening.  If you   are at high risk for breast cancer, talk to your health care provider about having an MRI and a mammogram every year.  Breast cancer gene (BRCA) assessment is recommended for women who have family members with BRCA-related cancers. BRCA-related cancers include:  Breast.  Ovarian.  Tubal.  Peritoneal cancers.  Results of the assessment will determine the need for genetic counseling and BRCA1 and BRCA2 testing. Cervical Cancer Your health care provider may recommend that you be screened regularly for cancer of the pelvic organs (ovaries, uterus, and  vagina). This screening involves a pelvic examination, including checking for microscopic changes to the surface of your cervix (Pap test). You may be encouraged to have this screening done every 3 years, beginning at age 21.  For women ages 30-65, health care providers may recommend pelvic exams and Pap testing every 3 years, or they may recommend the Pap and pelvic exam, combined with testing for human papilloma virus (HPV), every 5 years. Some types of HPV increase your risk of cervical cancer. Testing for HPV may also be done on women of any age with unclear Pap test results.  Other health care providers may not recommend any screening for nonpregnant women who are considered low risk for pelvic cancer and who do not have symptoms. Ask your health care provider if a screening pelvic exam is right for you.  If you have had past treatment for cervical cancer or a condition that could lead to cancer, you need Pap tests and screening for cancer for at least 20 years after your treatment. If Pap tests have been discontinued, your risk factors (such as having a new sexual partner) need to be reassessed to determine if screening should resume. Some women have medical problems that increase the chance of getting cervical cancer. In these cases, your health care provider may recommend more frequent screening and Pap tests. Colorectal Cancer  This type of cancer can be detected and often prevented.  Routine colorectal cancer screening usually begins at 47 years of age and continues through 47 years of age.  Your health care provider may recommend screening at an earlier age if you have risk factors for colon cancer.  Your health care provider may also recommend using home test kits to check for hidden blood in the stool.  A small camera at the end of a tube can be used to examine your colon directly (sigmoidoscopy or colonoscopy). This is done to check for the earliest forms of colorectal  cancer.  Routine screening usually begins at age 50.  Direct examination of the colon should be repeated every 5-10 years through 47 years of age. However, you may need to be screened more often if early forms of precancerous polyps or small growths are found. Skin Cancer  Check your skin from head to toe regularly.  Tell your health care provider about any new moles or changes in moles, especially if there is a change in a mole's shape or color.  Also tell your health care provider if you have a mole that is larger than the size of a pencil eraser.  Always use sunscreen. Apply sunscreen liberally and repeatedly throughout the day.  Protect yourself by wearing long sleeves, pants, a wide-brimmed hat, and sunglasses whenever you are outside. HEART DISEASE, DIABETES, AND HIGH BLOOD PRESSURE   High blood pressure causes heart disease and increases the risk of stroke. High blood pressure is more likely to develop in:  People who have blood pressure in the high end   of the normal range (130-139/85-89 mm Hg).  People who are overweight or obese.  People who are African American.  If you are 38-23 years of age, have your blood pressure checked every 3-5 years. If you are 61 years of age or older, have your blood pressure checked every year. You should have your blood pressure measured twice--once when you are at a hospital or clinic, and once when you are not at a hospital or clinic. Record the average of the two measurements. To check your blood pressure when you are not at a hospital or clinic, you can use:  An automated blood pressure machine at a pharmacy.  A home blood pressure monitor.  If you are between 45 years and 39 years old, ask your health care provider if you should take aspirin to prevent strokes.  Have regular diabetes screenings. This involves taking a blood sample to check your fasting blood sugar level.  If you are at a normal weight and have a low risk for diabetes,  have this test once every three years after 47 years of age.  If you are overweight and have a high risk for diabetes, consider being tested at a younger age or more often. PREVENTING INFECTION  Hepatitis B  If you have a higher risk for hepatitis B, you should be screened for this virus. You are considered at high risk for hepatitis B if:  You were born in a country where hepatitis B is common. Ask your health care provider which countries are considered high risk.  Your parents were born in a high-risk country, and you have not been immunized against hepatitis B (hepatitis B vaccine).  You have HIV or AIDS.  You use needles to inject street drugs.  You live with someone who has hepatitis B.  You have had sex with someone who has hepatitis B.  You get hemodialysis treatment.  You take certain medicines for conditions, including cancer, organ transplantation, and autoimmune conditions. Hepatitis C  Blood testing is recommended for:  Everyone born from [birth year] through 1963.  Anyone with known risk factors for hepatitis C. Sexually transmitted infections (STIs)  You should be screened for sexually transmitted infections (STIs) including gonorrhea and chlamydia if:  You are sexually active and are younger than 47 years of age.  You are older than 47 years of age and your health care provider tells you that you are at risk for this type of infection.  Your sexual activity has changed since you were last screened and you are at an increased risk for chlamydia or gonorrhea. Ask your health care provider if you are at risk.  If you do not have HIV, but are at risk, it may be recommended that you take a prescription medicine daily to prevent HIV infection. This is called pre-exposure prophylaxis (PrEP). You are considered at risk if:  You are sexually active and do not regularly use condoms or know the HIV status of your partner(s).  You take drugs by injection.  You are sexually  active with a partner who has HIV. Talk with your health care provider about whether you are at high risk of being infected with HIV. If you choose to begin PrEP, you should first be tested for HIV. You should then be tested every 3 months for as long as you are taking PrEP.  PREGNANCY   If you are premenopausal and you may become pregnant, ask your health care provider about preconception counseling.  If you may  become pregnant, take 400 to 800 micrograms (mcg) of folic acid every day.  If you want to prevent pregnancy, talk to your health care provider about birth control (contraception). OSTEOPOROSIS AND MENOPAUSE   Osteoporosis is a disease in which the bones lose minerals and strength with aging. This can result in serious bone fractures. Your risk for osteoporosis can be identified using a bone density scan.  If you are 61 years of age or older, or if you are at risk for osteoporosis and fractures, ask your health care provider if you should be screened.  Ask your health care provider whether you should take a calcium or vitamin D supplement to lower your risk for osteoporosis.  Menopause may have certain physical symptoms and risks.  Hormone replacement therapy may reduce some of these symptoms and risks. Talk to your health care provider about whether hormone replacement therapy is right for you.  HOME CARE INSTRUCTIONS   Schedule regular health, dental, and eye exams.  Stay current with your immunizations.   Do not use any tobacco products including cigarettes, chewing tobacco, or electronic cigarettes.  If you are pregnant, do not drink alcohol.  If you are breastfeeding, limit how much and how often you drink alcohol.  Limit alcohol intake to no more than 1 drink per day for nonpregnant women. One drink equals 12 ounces of beer, 5 ounces of wine, or 1 ounces of hard liquor.  Do not use street drugs.  Do not share needles.  Ask your health care provider for help if  you need support or information about quitting drugs.  Tell your health care provider if you often feel depressed.  Tell your health care provider if you have ever been abused or do not feel safe at home.   This information is not intended to replace advice given to you by your health care provider. Make sure you discuss any questions you have with your health care provider.   Document Released: 07/11/2010 Document Revised: 01/16/2014 Document Reviewed: 11/27/2012 Elsevier Interactive Patient Education [employer].

## 2014-10-19 NOTE — Assessment & Plan Note (Signed)
During pregnancy Will continue to monitor

## 2014-11-02 ENCOUNTER — Other Ambulatory Visit (INDEPENDENT_AMBULATORY_CARE_PROVIDER_SITE_OTHER): Payer: 59

## 2014-11-02 DIAGNOSIS — D72819 Decreased white blood cell count, unspecified: Secondary | ICD-10-CM | POA: Diagnosis not present

## 2014-11-02 LAB — CBC WITH DIFFERENTIAL/PLATELET
BASOS PCT: 0.8 % (ref 0.0–3.0)
Basophils Absolute: 0 10*3/uL (ref 0.0–0.1)
EOS PCT: 2.3 % (ref 0.0–5.0)
Eosinophils Absolute: 0.1 10*3/uL (ref 0.0–0.7)
HEMATOCRIT: 41.9 % (ref 36.0–46.0)
HEMOGLOBIN: 14 g/dL (ref 12.0–15.0)
LYMPHS PCT: 30.7 % (ref 12.0–46.0)
Lymphs Abs: 1.1 10*3/uL (ref 0.7–4.0)
MCHC: 33.4 g/dL (ref 30.0–36.0)
MCV: 95.7 fl (ref 78.0–100.0)
MONOS PCT: 7.7 % (ref 3.0–12.0)
Monocytes Absolute: 0.3 10*3/uL (ref 0.1–1.0)
Neutro Abs: 2.1 10*3/uL (ref 1.4–7.7)
Neutrophils Relative %: 58.5 % (ref 43.0–77.0)
Platelets: 173 10*3/uL (ref 150.0–400.0)
RBC: 4.38 Mil/uL (ref 3.87–5.11)
RDW: 12.1 % (ref 11.5–15.5)
WBC: 3.6 10*3/uL — AB (ref 4.0–10.5)

## 2015-01-07 ENCOUNTER — Encounter: Payer: Self-pay | Admitting: Internal Medicine

## 2015-01-07 ENCOUNTER — Ambulatory Visit (INDEPENDENT_AMBULATORY_CARE_PROVIDER_SITE_OTHER): Payer: 59 | Admitting: Internal Medicine

## 2015-01-07 VITALS — BP 132/78 | HR 86 | Temp 98.1°F | Wt 126.0 lb

## 2015-01-07 DIAGNOSIS — M5442 Lumbago with sciatica, left side: Secondary | ICD-10-CM | POA: Diagnosis not present

## 2015-01-07 MED ORDER — METHOCARBAMOL 500 MG PO TABS: 500.0000 mg | ORAL_TABLET | Freq: Every evening | ORAL | Status: DC | PRN

## 2015-01-07 MED ORDER — PREDNISONE 20 MG PO TABS: ORAL_TABLET | ORAL | Status: DC

## 2015-01-07 NOTE — Progress Notes (Signed)
Subjective:    Patient ID: Jill Gay, female    DOB: 05/31/1965, 49 y.o.   MRN: [id number]  HPI  Pt presents to the clinic today with c/o back pain. This started 1 month ago. The pain is in her lumbar spine. She describes the pain as dull and achy but is sharp with certain movements. The pain radiates into her left leg. There is some associated numbness and tingling. She denies any injury to the area. She has tried Aleve, Ibuprofen, Tylenol, heat and ice without any relief. She has had prior back problems and she has been following with a chiropractor 1-2 times per week. She does feel like the pain is worse instead of better. The chiropractor has done a lumbar xray and MRI of her back.  Review of Systems      Past Medical History  Diagnosis Date  . CHICKENPOX, HX OF   . VENEREAL WART   . PUD (peptic ulcer disease)   . Gestational hypertension     No current outpatient prescriptions on file.   No current facility-administered medications for this visit.    No Known Allergies  Family History  Problem Relation Age of Onset  . Melanoma Sister   . Alcohol abuse Mother   . Arthritis Mother   . Depression Mother   . Alcohol abuse Maternal Grandmother   . Arthritis Maternal Grandmother   . Heart disease Maternal Grandmother   . Stroke Maternal Grandmother     Social History   Social History  . Marital Status: Single    Spouse Name: N/A  . Number of Children: N/A  . Years of Education: N/A   Occupational History  . Not on file.   Social History Main Topics  . Smoking status: Former Games developer  . Smokeless tobacco: Never Used     Comment: quit in 2016  . Alcohol Use: 0.0 oz/week    0 Standard drinks or equivalent per week     Comment: social  . Drug Use: No  . Sexual Activity: Yes   Other Topics Concern  . Not on file   Social History Narrative     Constitutional: Denies fever, malaise, fatigue, headache or abrupt weight changes.  Respiratory: Denies  difficulty breathing, shortness of breath, cough or sputum production.   Cardiovascular: Denies chest pain, chest tightness, palpitations or swelling in the hands or feet.  Gastrointestinal: Denies abdominal pain, bloating, constipation, diarrhea or blood in the stool.  GU: Denies urgency, frequency, pain with urination, burning sensation, blood in urine, odor or discharge. Musculoskeletal: Pt reports back pain. Denies difficulty with gait, muscle pain or joint swelling.  Neurological: Denies dizziness, difficulty with memory, difficulty with speech or problems with balance and coordination.    No other specific complaints in a complete review of systems (except as listed in HPI above).  Objective:   Physical Exam   BP 132/78 mmHg  Pulse 86  Temp(Src) 98.1 F (36.7 C) (Oral)  Wt 126 lb (57.153 kg)  SpO2 98%  LMP 12/31/2014 Wt Readings from Last 3 Encounters:  01/07/15 126 lb (57.153 kg)  10/19/14 123 lb 8 oz (56.019 kg)  10/08/13 133 lb (60.328 kg)    General: Appears her stated age, well developed, well nourished in NAD.  Cardiovascular: Normal rate and rhythm. S1,S2 noted.  No murmur, rubs or gallops noted.  Pulmonary/Chest: Normal effort and positive vesicular breath sounds. No respiratory distress. No wheezes, rales or ronchi noted.  Musculoskeletal: Decreased flexion, extension and rotation  to the left. Normal rotation to the right. Pain with palpation over the lumbar spine. Strength 4/5 LLE, 5/5 RLE. Neurological: Alert and oriented. Positive SLR on the left.   BMET    Component Value Date/Time   NA 143 10/19/2014 0959   K 4.1 10/19/2014 0959   CL 106 10/19/2014 0959   CO2 31 10/19/2014 0959   GLUCOSE 97 10/19/2014 0959   BUN 9 10/19/2014 0959   CREATININE 0.78 10/19/2014 0959   CALCIUM 9.2 10/19/2014 0959   GFRNONAA 84.20 07/15/2008 0846    Lipid Panel     Component Value Date/Time   CHOL 166 10/19/2014 0959   TRIG 92.0 10/19/2014 0959   HDL 69.10  10/19/2014 0959   CHOLHDL 2 10/19/2014 0959   VLDL 18.4 10/19/2014 0959   LDLCALC 79 10/19/2014 0959    CBC    Component Value Date/Time   WBC 3.6* 11/02/2014 0833   RBC 4.38 11/02/2014 0833   HGB 14.0 11/02/2014 0833   HCT 41.9 11/02/2014 0833   PLT 173.0 11/02/2014 0833   MCV 95.7 11/02/2014 0833   MCHC 33.4 11/02/2014 0833   RDW 12.1 11/02/2014 0833   LYMPHSABS 1.1 11/02/2014 0833   MONOABS 0.3 11/02/2014 0833   EOSABS 0.1 11/02/2014 0833   BASOSABS 0.0 11/02/2014 0833    Hgb A1C No results found for: HGBA1C      Assessment & Plan:   Low back pain with left side sciatica:  Stretching exercises given eRx for Pred Taper x 9 days eRx for Robaxin 500 mg QHS prn Return precautions given  RTC as needed or if symptoms persist or worsen

## 2015-01-07 NOTE — Progress Notes (Signed)
Pre visit review using our clinic review tool, if applicable. No additional management support is needed unless otherwise documented below in the visit note. 

## 2015-01-07 NOTE — Patient Instructions (Signed)

## 2015-01-15 ENCOUNTER — Telehealth: Payer: Self-pay | Admitting: Internal Medicine

## 2015-01-15 NOTE — Telephone Encounter (Signed)
Baity pt--please advise

## 2015-01-15 NOTE — Telephone Encounter (Signed)
Pt called back to let you know her sciatica pain is a little better still having problems walking at night She wanted to know what she needs to do next

## 2015-01-15 NOTE — Telephone Encounter (Signed)
BEST NUMBER [phone] Pt called wanting to know what she needs to do next

## 2015-01-15 NOTE — Telephone Encounter (Signed)
Completed prednisone course.  Left message. rec continued muscle relaxant, trial ibuprofen 400mg  BID with food. plz call Monday for an update.

## 2015-01-19 NOTE — Telephone Encounter (Signed)
Pt left v/m; pt is no better; pt still in pain and wants to know what is next course of action.

## 2015-01-19 NOTE — Telephone Encounter (Signed)
We need to get xray and MRI of lumbar spine. Have her make follow up appt.

## 2015-01-19 NOTE — Telephone Encounter (Signed)
Pt has scheduled appt to be seen

## 2015-01-20 ENCOUNTER — Encounter: Payer: Self-pay | Admitting: Internal Medicine

## 2015-01-20 ENCOUNTER — Ambulatory Visit (INDEPENDENT_AMBULATORY_CARE_PROVIDER_SITE_OTHER): Payer: 59 | Admitting: Internal Medicine

## 2015-01-20 VITALS — BP 132/64 | HR 86 | Wt 130.8 lb

## 2015-01-20 DIAGNOSIS — M5442 Lumbago with sciatica, left side: Secondary | ICD-10-CM | POA: Diagnosis not present

## 2015-01-20 MED ORDER — HYDROCODONE-ACETAMINOPHEN 5-325 MG PO TABS: 1.0000 | ORAL_TABLET | Freq: Three times a day (TID) | ORAL | Status: DC | PRN

## 2015-01-20 NOTE — Patient Instructions (Signed)
Sciatica With Rehab The sciatic nerve runs from the back down the leg and is responsible for sensation and control of the muscles in the back (posterior) side of the thigh, lower leg, and foot. Sciatica is a condition that is characterized by inflammation of this nerve.  SYMPTOMS   Signs of nerve damage, including numbness and/or weakness along the posterior side of the lower extremity.  Pain in the back of the thigh that may also travel down the leg.  Pain that worsens when sitting for long periods of time.  Occasionally, pain in the back or buttock. CAUSES  Inflammation of the sciatic nerve is the cause of sciatica. The inflammation is due to something irritating the nerve. Common sources of irritation include:  Sitting for long periods of time.  Direct trauma to the nerve.  Arthritis of the spine.  Herniated or ruptured disk.  Slipping of the vertebrae (spondylolisthesis).  Pressure from soft tissues, such as muscles or ligament-like tissue (fascia). RISK INCREASES WITH:  Sports that place pressure or stress on the spine (football or weightlifting).  Poor strength and flexibility.  Failure to warm up properly before activity.  Family history of low back pain or disk disorders.  Previous back injury or surgery.  Poor body mechanics, especially when lifting, or poor posture. PREVENTION   Warm up and stretch properly before activity.  Maintain physical fitness:  Strength, flexibility, and endurance.  Cardiovascular fitness.  Learn and use proper technique, especially with posture and lifting. When possible, have coach correct improper technique.  Avoid activities that place stress on the spine. PROGNOSIS If treated properly, then sciatica usually resolves within 6 weeks. However, occasionally surgery is necessary.  RELATED COMPLICATIONS   Permanent nerve damage, including pain, numbness, tingle, or weakness.  Chronic back pain.  Risks of surgery: infection,  bleeding, nerve damage, or damage to surrounding tissues. TREATMENT Treatment initially involves resting from any activities that aggravate your symptoms. The use of ice and medication may help reduce pain and inflammation. The use of strengthening and stretching exercises may help reduce pain with activity. These exercises may be performed at home or with referral to a therapist. A therapist may recommend further treatments, such as transcutaneous electronic nerve stimulation (TENS) or ultrasound. Your caregiver may recommend corticosteroid injections to help reduce inflammation of the sciatic nerve. If symptoms persist despite non-surgical (conservative) treatment, then surgery may be recommended. MEDICATION  If pain medication is necessary, then nonsteroidal anti-inflammatory medications, such as aspirin and ibuprofen, or other minor pain relievers, such as acetaminophen, are often recommended.  Do not take pain medication for 7 days before surgery.  Prescription pain relievers may be given if deemed necessary by your caregiver. Use only as directed and only as much as you need.  Ointments applied to the skin may be helpful.  Corticosteroid injections may be given by your caregiver. These injections should be reserved for the most serious cases, because they may only be given a certain number of times. HEAT AND COLD  Cold treatment (icing) relieves pain and reduces inflammation. Cold treatment should be applied for 10 to 15 minutes every 2 to 3 hours for inflammation and pain and immediately after any activity that aggravates your symptoms. Use ice packs or massage the area with a piece of ice (ice massage).  Heat treatment may be used prior to performing the stretching and strengthening activities prescribed by your caregiver, physical therapist, or athletic trainer. Use a heat pack or soak the injury in warm water.   SEEK MEDICAL CARE IF:  Treatment seems to offer no benefit, or the condition  worsens.  Any medications produce adverse side effects. EXERCISES  RANGE OF MOTION (ROM) AND STRETCHING EXERCISES - Sciatica Most people with sciatic will find that their symptoms worsen with either excessive bending forward (flexion) or arching at the low back (extension). The exercises which will help resolve your symptoms will focus on the opposite motion. Your physician, physical therapist or athletic trainer will help you determine which exercises will be most helpful to resolve your low back pain. Do not complete any exercises without first consulting with your clinician. Discontinue any exercises which worsen your symptoms until you speak to your clinician. If you have pain, numbness or tingling which travels down into your buttocks, leg or foot, the goal of the therapy is for these symptoms to move closer to your back and eventually resolve. Occasionally, these leg symptoms will get better, but your low back pain may worsen; this is typically an indication of progress in your rehabilitation. Be certain to be very alert to any changes in your symptoms and the activities in which you participated in the 24 hours prior to the change. Sharing this information with your clinician will allow him/her to most efficiently treat your condition. These exercises may help you when beginning to rehabilitate your injury. Your symptoms may resolve with or without further involvement from your physician, physical therapist or athletic trainer. While completing these exercises, remember:   Restoring tissue flexibility helps normal motion to return to the joints. This allows healthier, less painful movement and activity.  An effective stretch should be held for at least 30 seconds.  A stretch should never be painful. You should only feel a gentle lengthening or release in the stretched tissue. FLEXION RANGE OF MOTION AND STRETCHING EXERCISES: STRETCH - Flexion, Single Knee to Chest   Lie on a firm bed or floor  with both legs extended in front of you.  Keeping one leg in contact with the floor, bring your opposite knee to your chest. Hold your leg in place by either grabbing behind your thigh or at your knee.  Pull until you feel a gentle stretch in your low back. Hold __________ seconds.  Slowly release your grasp and repeat the exercise with the opposite side. Repeat __________ times. Complete this exercise __________ times per day.  STRETCH - Flexion, Double Knee to Chest  Lie on a firm bed or floor with both legs extended in front of you.  Keeping one leg in contact with the floor, bring your opposite knee to your chest.  Tense your stomach muscles to support your back and then lift your other knee to your chest. Hold your legs in place by either grabbing behind your thighs or at your knees.  Pull both knees toward your chest until you feel a gentle stretch in your low back. Hold __________ seconds.  Tense your stomach muscles and slowly return one leg at a time to the floor. Repeat __________ times. Complete this exercise __________ times per day.  STRETCH - Low Trunk Rotation   Lie on a firm bed or floor. Keeping your legs in front of you, bend your knees so they are both pointed toward the ceiling and your feet are flat on the floor.  Extend your arms out to the side. This will stabilize your upper body by keeping your shoulders in contact with the floor.  Gently and slowly drop both knees together to one side until   you feel a gentle stretch in your low back. Hold for __________ seconds.  Tense your stomach muscles to support your low back as you bring your knees back to the starting position. Repeat the exercise to the other side. Repeat __________ times. Complete this exercise __________ times per day  EXTENSION RANGE OF MOTION AND FLEXIBILITY EXERCISES: STRETCH - Extension, Prone on Elbows  Lie on your stomach on the floor, a bed will be too soft. Place your palms about shoulder  width apart and at the height of your head.  Place your elbows under your shoulders. If this is too painful, stack pillows under your chest.  Allow your body to relax so that your hips drop lower and make contact more completely with the floor.  Hold this position for __________ seconds.  Slowly return to lying flat on the floor. Repeat __________ times. Complete this exercise __________ times per day.  RANGE OF MOTION - Extension, Prone Press Ups  Lie on your stomach on the floor, a bed will be too soft. Place your palms about shoulder width apart and at the height of your head.  Keeping your back as relaxed as possible, slowly straighten your elbows while keeping your hips on the floor. You may adjust the placement of your hands to maximize your comfort. As you gain motion, your hands will come more underneath your shoulders.  Hold this position __________ seconds.  Slowly return to lying flat on the floor. Repeat __________ times. Complete this exercise __________ times per day.  STRENGTHENING EXERCISES - Sciatica  These exercises may help you when beginning to rehabilitate your injury. These exercises should be done near your "sweet spot." This is the neutral, low-back arch, somewhere between fully rounded and fully arched, that is your least painful position. When performed in this safe range of motion, these exercises can be used for people who have either a flexion or extension based injury. These exercises may resolve your symptoms with or without further involvement from your physician, physical therapist or athletic trainer. While completing these exercises, remember:   Muscles can gain both the endurance and the strength needed for everyday activities through controlled exercises.  Complete these exercises as instructed by your physician, physical therapist or athletic trainer. Progress with the resistance and repetition exercises only as your caregiver advises.  You may  experience muscle soreness or fatigue, but the pain or discomfort you are trying to eliminate should never worsen during these exercises. If this pain does worsen, stop and make certain you are following the directions exactly. If the pain is still present after adjustments, discontinue the exercise until you can discuss the trouble with your clinician. STRENGTHENING - Deep Abdominals, Pelvic Tilt   Lie on a firm bed or floor. Keeping your legs in front of you, bend your knees so they are both pointed toward the ceiling and your feet are flat on the floor.  Tense your lower abdominal muscles to press your low back into the floor. This motion will rotate your pelvis so that your tail bone is scooping upwards rather than pointing at your feet or into the floor.  With a gentle tension and even breathing, hold this position for __________ seconds. Repeat __________ times. Complete this exercise __________ times per day.  STRENGTHENING - Abdominals, Crunches   Lie on a firm bed or floor. Keeping your legs in front of you, bend your knees so they are both pointed toward the ceiling and your feet are flat on the   floor. Cross your arms over your chest.  Slightly tip your chin down without bending your neck.  Tense your abdominals and slowly lift your trunk high enough to just clear your shoulder blades. Lifting higher can put excessive stress on the low back and does not further strengthen your abdominal muscles.  Control your return to the starting position. Repeat __________ times. Complete this exercise __________ times per day.  STRENGTHENING - Quadruped, Opposite UE/LE Lift  Assume a hands and knees position on a firm surface. Keep your hands under your shoulders and your knees under your hips. You may place padding under your knees for comfort.  Find your neutral spine and gently tense your abdominal muscles so that you can maintain this position. Your shoulders and hips should form a rectangle  that is parallel with the floor and is not twisted.  Keeping your trunk steady, lift your right hand no higher than your shoulder and then your left leg no higher than your hip. Make sure you are not holding your breath. Hold this position __________ seconds.  Continuing to keep your abdominal muscles tense and your back steady, slowly return to your starting position. Repeat with the opposite arm and leg. Repeat __________ times. Complete this exercise __________ times per day.  STRENGTHENING - Abdominals and Quadriceps, Straight Leg Raise   Lie on a firm bed or floor with both legs extended in front of you.  Keeping one leg in contact with the floor, bend the other knee so that your foot can rest flat on the floor.  Find your neutral spine, and tense your abdominal muscles to maintain your spinal position throughout the exercise.  Slowly lift your straight leg off the floor about 6 inches for a count of 15, making sure to not hold your breath.  Still keeping your neutral spine, slowly lower your leg all the way to the floor. Repeat this exercise with each leg __________ times. Complete this exercise __________ times per day. POSTURE AND BODY MECHANICS CONSIDERATIONS - Sciatica Keeping correct posture when sitting, standing or completing your activities will reduce the stress put on different body tissues, allowing injured tissues a chance to heal and limiting painful experiences. The following are general guidelines for improved posture. Your physician or physical therapist will provide you with any instructions specific to your needs. While reading these guidelines, remember:  The exercises prescribed by your provider will help you have the flexibility and strength to maintain correct postures.  The correct posture provides the optimal environment for your joints to work. All of your joints have less wear and tear when properly supported by a spine with good posture. This means you will  experience a healthier, less painful body.  Correct posture must be practiced with all of your activities, especially prolonged sitting and standing. Correct posture is as important when doing repetitive low-stress activities (typing) as it is when doing a single heavy-load activity (lifting). RESTING POSITIONS Consider which positions are most painful for you when choosing a resting position. If you have pain with flexion-based activities (sitting, bending, stooping, squatting), choose a position that allows you to rest in a less flexed posture. You would want to avoid curling into a fetal position on your side. If your pain worsens with extension-based activities (prolonged standing, working overhead), avoid resting in an extended position such as sleeping on your stomach. Most people will find more comfort when they rest with their spine in a more neutral position, neither too rounded nor too   arched. Lying on a non-sagging bed on your side with a pillow between your knees, or on your back with a pillow under your knees will often provide some relief. Keep in mind, being in any one position for a prolonged period of time, no matter how correct your posture, can still lead to stiffness. PROPER SITTING POSTURE In order to minimize stress and discomfort on your spine, you must sit with correct posture Sitting with good posture should be effortless for a healthy body. Returning to good posture is a gradual process. Many people can work toward this most comfortably by using various supports until they have the flexibility and strength to maintain this posture on their own. When sitting with proper posture, your ears will fall over your shoulders and your shoulders will fall over your hips. You should use the back of the chair to support your upper back. Your low back will be in a neutral position, just slightly arched. You may place a small pillow or folded towel at the base of your low back for support.  When  working at a desk, create an environment that supports good, upright posture. Without extra support, muscles fatigue and lead to excessive strain on joints and other tissues. Keep these recommendations in mind: CHAIR:   A chair should be able to slide under your desk when your back makes contact with the back of the chair. This allows you to work closely.  The chair's height should allow your eyes to be level with the upper part of your monitor and your hands to be slightly lower than your elbows. BODY POSITION  Your feet should make contact with the floor. If this is not possible, use a foot rest.  Keep your ears over your shoulders. This will reduce stress on your neck and low back. INCORRECT SITTING POSTURES   If you are feeling tired and unable to assume a healthy sitting posture, do not slouch or slump. This puts excessive strain on your back tissues, causing more damage and pain. Healthier options include:  Using more support, like a lumbar pillow.  Switching tasks to something that requires you to be upright or walking.  Talking a brief walk.  Lying down to rest in a neutral-spine position. PROLONGED STANDING WHILE SLIGHTLY LEANING FORWARD  When completing a task that requires you to lean forward while standing in one place for a long time, place either foot up on a stationary 2-4 inch high object to help maintain the best posture. When both feet are on the ground, the low back tends to lose its slight inward curve. If this curve flattens (or becomes too large), then the back and your other joints will experience too much stress, fatigue more quickly and can cause pain.  CORRECT STANDING POSTURES Proper standing posture should be assumed with all daily activities, even if they only take a few moments, like when brushing your teeth. As in sitting, your ears should fall over your shoulders and your shoulders should fall over your hips. You should keep a slight tension in your abdominal  muscles to brace your spine. Your tailbone should point down to the ground, not behind your body, resulting in an over-extended swayback posture.  INCORRECT STANDING POSTURES  Common incorrect standing postures include a forward head, locked knees and/or an excessive swayback. WALKING Walk with an upright posture. Your ears, shoulders and hips should all line-up. PROLONGED ACTIVITY IN A FLEXED POSITION When completing a task that requires you to bend forward   at your waist or lean over a low surface, try to find a way to stabilize 3 of 4 of your limbs. You can place a hand or elbow on your thigh or rest a knee on the surface you are reaching across. This will provide you more stability so that your muscles do not fatigue as quickly. By keeping your knees relaxed, or slightly bent, you will also reduce stress across your low back. CORRECT LIFTING TECHNIQUES DO :   Assume a wide stance. This will provide you more stability and the opportunity to get as close as possible to the object which you are lifting.  Tense your abdominals to brace your spine; then bend at the knees and hips. Keeping your back locked in a neutral-spine position, lift using your leg muscles. Lift with your legs, keeping your back straight.  Test the weight of unknown objects before attempting to lift them.  Try to keep your elbows locked down at your sides in order get the best strength from your shoulders when carrying an object.  Always ask for help when lifting heavy or awkward objects. INCORRECT LIFTING TECHNIQUES DO NOT:   Lock your knees when lifting, even if it is a small object.  Bend and twist. Pivot at your feet or move your feet when needing to change directions.  Assume that you cannot safely pick up a paperclip without proper posture.   This information is not intended to replace advice given to you by your health care provider. Make sure you discuss any questions you have with your health care provider.     Document Released: 12/26/2004 Document Revised: 05/12/2014 Document Reviewed: 04/09/2008 [employer] Patient Education 2016 [employer]  

## 2015-01-20 NOTE — Progress Notes (Signed)
Subjective:    Patient ID: Jill SicksDarlene Cothron, female    DOB: 03-Dec-1965, 50 y.o.   MRN: [id number]  HPI  Pt presents to the clinic today to follow up back pain. She was seen 12/29 for the same. The back pain had started 1 month prior. The pain starts in her lumbar spine and radiated into her left leg. She describes the pain as achy but sharp and stabbing with certain movements. She failed OTC antiinflammatories. She was following with a chiropractor, who had sone a xray of her lumbar spine, until she felt like her pain was getting worse. She was treated with Robaxin and Prednisone She did get some improvement while on Prednisone but the pain is sever now that she is off the Prednisone. She is taking Tylenol, Naproxen and Roboxin with minimal relief.  Review of Systems      Past Medical History  Diagnosis Date  . CHICKENPOX, HX OF   . VENEREAL WART   . PUD (peptic ulcer disease)   . Gestational hypertension     Current Outpatient Prescriptions  Medication Sig Dispense Refill  . methocarbamol (ROBAXIN) 500 MG tablet Take 1 tablet (500 mg total) by mouth at bedtime as needed for muscle spasms. 20 tablet 0  . predniSONE (DELTASONE) 20 MG tablet Take 3 tabs on days 1-3, take 2 tabs on days 4-6, take 1 tab on days 7-9 18 tablet 0   No current facility-administered medications for this visit.    No Known Allergies  Family History  Problem Relation Age of Onset  . Melanoma Sister   . Alcohol abuse Mother   . Arthritis Mother   . Depression Mother   . Alcohol abuse Maternal Grandmother   . Arthritis Maternal Grandmother   . Heart disease Maternal Grandmother   . Stroke Maternal Grandmother     Social History   Social History  . Marital Status: Single    Spouse Name: N/A  . Number of Children: N/A  . Years of Education: N/A   Occupational History  . Not on file.   Social History Main Topics  . Smoking status: Former Games developermoker  . Smokeless tobacco: Never Used     Comment:  quit in 2016  . Alcohol Use: 0.0 oz/week    0 Standard drinks or equivalent per week     Comment: social  . Drug Use: No  . Sexual Activity: Yes   Other Topics Concern  . Not on file   Social History Narrative     Constitutional: Denies fever, malaise, fatigue, headache or abrupt weight changes.  Gastrointestinal: Denies abdominal pain, bloating, constipation, diarrhea or blood in the stool.  GU: Denies urgency, frequency, pain with urination, burning sensation, blood in urine, odor or discharge. Musculoskeletal: Pt reports back pain. Denies decrease in range of motion, difficulty with gait, or joint pain and swelling.   No other specific complaints in a complete review of systems (except as listed in HPI above).  Objective:   Physical Exam    BP 132/64 mmHg  Pulse 86  Wt 130 lb 12.8 oz (59.33 kg)  SpO2 97%  LMP 12/31/2014 Wt Readings from Last 3 Encounters:  01/20/15 130 lb 12.8 oz (59.33 kg)  01/07/15 126 lb (57.153 kg)  10/19/14 123 lb 8 oz (56.019 kg)    General: Appears her stated age, well developed, well nourished in NAD. Musculoskeletal: Normal flexion and rotation of the spine. Decreased extension. Bony tenderness noted over the lumbar spine. Pain with palpation  of the paralumbar muscles on the left. Strength 4/5 LLE, 5/5 RLE. Neurological: Alert and oriented. Positive SLR on the left.  BMET    Component Value Date/Time   NA 143 10/19/2014 0959   K 4.1 10/19/2014 0959   CL 106 10/19/2014 0959   CO2 31 10/19/2014 0959   GLUCOSE 97 10/19/2014 0959   BUN 9 10/19/2014 0959   CREATININE 0.78 10/19/2014 0959   CALCIUM 9.2 10/19/2014 0959   GFRNONAA 84.20 07/15/2008 0846    Lipid Panel     Component Value Date/Time   CHOL 166 10/19/2014 0959   TRIG 92.0 10/19/2014 0959   HDL 69.10 10/19/2014 0959   CHOLHDL 2 10/19/2014 0959   VLDL 18.4 10/19/2014 0959   LDLCALC 79 10/19/2014 0959    CBC    Component Value Date/Time   WBC 3.6* 11/02/2014 0833    RBC 4.38 11/02/2014 0833   HGB 14.0 11/02/2014 0833   HCT 41.9 11/02/2014 0833   PLT 173.0 11/02/2014 0833   MCV 95.7 11/02/2014 0833   MCHC 33.4 11/02/2014 0833   RDW 12.1 11/02/2014 0833   LYMPHSABS 1.1 11/02/2014 0833   MONOABS 0.3 11/02/2014 0833   EOSABS 0.1 11/02/2014 0833   BASOSABS 0.0 11/02/2014 0833    Hgb A1C No results found for: HGBA1C     Assessment & Plan:   Low back pain with left side sciatica:  Medical records release signed to obtain recent xray MRI ordered of lumbar spine RX given for Norco 5-325 Q8H prn- do not take additional Tylenol Ok to continue Naproxen Continue stretchign exeercise May need referral to neurosurgery for further eval, but will wait for MRI results  Will follow up after MRI, RTC as needed

## 2015-01-27 ENCOUNTER — Other Ambulatory Visit: Payer: Self-pay | Admitting: Internal Medicine

## 2015-01-27 DIAGNOSIS — M5442 Lumbago with sciatica, left side: Secondary | ICD-10-CM

## 2015-01-27 MED ORDER — METHOCARBAMOL 500 MG PO TABS: 500.0000 mg | ORAL_TABLET | Freq: Every evening | ORAL | Status: DC | PRN

## 2015-02-01 ENCOUNTER — Encounter: Payer: Self-pay | Admitting: Internal Medicine

## 2015-02-01 ENCOUNTER — Ambulatory Visit (INDEPENDENT_AMBULATORY_CARE_PROVIDER_SITE_OTHER): Payer: 59 | Admitting: Internal Medicine

## 2015-02-01 ENCOUNTER — Other Ambulatory Visit: Payer: Self-pay

## 2015-02-01 VITALS — BP 126/88 | HR 87 | Temp 98.3°F | Wt 127.0 lb

## 2015-02-01 DIAGNOSIS — M5442 Lumbago with sciatica, left side: Secondary | ICD-10-CM | POA: Diagnosis not present

## 2015-02-01 MED ORDER — PREDNISONE 20 MG PO TABS: ORAL_TABLET | ORAL | Status: DC

## 2015-02-01 MED ORDER — HYDROCODONE-ACETAMINOPHEN 5-325 MG PO TABS: 1.0000 | ORAL_TABLET | Freq: Three times a day (TID) | ORAL | Status: DC | PRN

## 2015-02-01 NOTE — Progress Notes (Signed)
Pre visit review using our clinic review tool, if applicable. No additional management support is needed unless otherwise documented below in the visit note. 

## 2015-02-01 NOTE — Patient Instructions (Signed)

## 2015-02-01 NOTE — Telephone Encounter (Signed)
Pt has appt scheduled to be seen today

## 2015-02-01 NOTE — Telephone Encounter (Signed)
Pt left v/m pt last seen and rx printed # 30 of hydrocodone apap on 01/20/15. Pt is taking two tabs q8h prn to get any relief of pain. Is this OK and if not will need stronger med. Pt request cb.

## 2015-02-01 NOTE — Telephone Encounter (Signed)
Yes, can we change sig?

## 2015-02-01 NOTE — Progress Notes (Signed)
Subjective:    Patient ID: Jill Gay, female    DOB: 10-02-1965, 50 y.o.   MRN: [id number]  HPI   Pt presents to the clinic today with worsening back pain and left sided sciatica. She was first seen 12/29 for back pain that had started 1 month prior (she had lumbar xray done by chiropractor). She was seen again on 1/11 for worsening pain. MRI was ordered and is now scheduled for Thursday morning 1/26. The pain starts in her lumbar spine and radiates down her left leg. The pain is worse when sitting. She reports numbness of the left calf and foot.  She denies lumbar and sacral numbness, difficulty with bowel or bladder. She failed antiimflammatories. She was originally treated with Robaxin and Prednisone, she experienced improvement while on Prednisone and then pain returned. She has been taking Norco 5-325 mg tablets as well as Robaxin, Ibuprophen, Tylenol and aAleve. She was given stretching exercises, but unable to do them because of symptom aggravation.   Review of Systems  Past Medical History  Diagnosis Date  . CHICKENPOX, HX OF   . VENEREAL WART   . PUD (peptic ulcer disease)   . Gestational hypertension     Current Outpatient Prescriptions  Medication Sig Dispense Refill  . HYDROcodone-acetaminophen (NORCO/VICODIN) 5-325 MG tablet Take 1-2 tablets by mouth every 8 (eight) hours as needed for moderate pain. 180 tablet 0  . methocarbamol (ROBAXIN) 500 MG tablet Take 1 tablet (500 mg total) by mouth at bedtime as needed for muscle spasms. 20 tablet 0  . predniSONE (DELTASONE) 20 MG tablet Take 3 tabs on days 1-3, take 2 tabs on days 4-6, take 1 tab on days 7-9 18 tablet 0   No current facility-administered medications for this visit.    No Known Allergies  Family History  Problem Relation Age of Onset  . Melanoma Sister   . Alcohol abuse Mother   . Arthritis Mother   . Depression Mother   . Alcohol abuse Maternal Grandmother   . Arthritis Maternal Grandmother   .  Heart disease Maternal Grandmother   . Stroke Maternal Grandmother     Social History   Social History  . Marital Status: Single    Spouse Name: N/A  . Number of Children: N/A  . Years of Education: N/A   Occupational History  . Not on file.   Social History Main Topics  . Smoking status: Former Games developer  . Smokeless tobacco: Never Used     Comment: quit in 2016  . Alcohol Use: 0.0 oz/week    0 Standard drinks or equivalent per week     Comment: social  . Drug Use: No  . Sexual Activity: Yes   Other Topics Concern  . Not on file   Social History Narrative     Constitutional: Positive headache. Denies fever, malaise, fatigue, or abrupt weight changes.  Gastrointestinal: Denies abdominal pain, bloating, constipation, diarrhea or blood in the stool.  GU: Denies urgency, frequency, pain with urination, burning sensation, blood in urine, odor or discharge. Musculoskeletal: Pt reports low back pain. Denies difficulty with gait. Neurological: Pt reports numbness in lower left leg and foot.  Denies dizziness.   No other specific complaints in a complete review of systems (except as listed in HPI above).     Objective:   Physical Exam BP 126/88 mmHg  Pulse 87  Temp(Src) 98.3 F (36.8 C) (Oral)  Wt 127 lb (57.607 kg)  SpO2 99%  LMP 12/31/2014 Wt  Readings from Last 3 Encounters:  02/01/15 127 lb (57.607 kg)  01/20/15 130 lb 12.8 oz (59.33 kg)  01/07/15 126 lb (57.153 kg)    General: Appears her stated age, uncomfortable, laying on side with minimal movement. Musculoskeletal: Tenderness directly over lumbar spine. No paravertebral muscle tenderness. Did not assess ROM at todays visit, because she is in severe pain. No difficulty with gait.  Neurological: Alert and oriented. Sensation intact.   BMET    Component Value Date/Time   NA 143 10/19/2014 0959   K 4.1 10/19/2014 0959   CL 106 10/19/2014 0959   CO2 31 10/19/2014 0959   GLUCOSE 97 10/19/2014 0959   BUN 9  10/19/2014 0959   CREATININE 0.78 10/19/2014 0959   CALCIUM 9.2 10/19/2014 0959   GFRNONAA 84.20 07/15/2008 0846    Lipid Panel     Component Value Date/Time   CHOL 166 10/19/2014 0959   TRIG 92.0 10/19/2014 0959   HDL 69.10 10/19/2014 0959   CHOLHDL 2 10/19/2014 0959   VLDL 18.4 10/19/2014 0959   LDLCALC 79 10/19/2014 0959    CBC    Component Value Date/Time   WBC 3.6* 11/02/2014 0833   RBC 4.38 11/02/2014 0833   HGB 14.0 11/02/2014 0833   HCT 41.9 11/02/2014 0833   PLT 173.0 11/02/2014 0833   MCV 95.7 11/02/2014 0833   MCHC 33.4 11/02/2014 0833   RDW 12.1 11/02/2014 0833   LYMPHSABS 1.1 11/02/2014 0833   MONOABS 0.3 11/02/2014 0833   EOSABS 0.1 11/02/2014 0833   BASOSABS 0.0 11/02/2014 0833    Hgb A1C No results found for: HGBA1C        Assessment & Plan:  Lower back pain with left sided sciatica:  MRI is scheduled for Thursday morning Call scheduling office to see if there are any cancellations for MRI tomorrow Continue Norco 5-325mg  2 tablets every 8 hours as needed, instructed not to take Tylenol Begin taking Robaxin  TID Rx for Prednisone taper  Instructed not to take Aleve or Ibuprophen while on Prednisone  If pain worsens, go to ED Will follow up after MRI

## 2015-02-03 ENCOUNTER — Ambulatory Visit (HOSPITAL_COMMUNITY)
Admission: RE | Admit: 2015-02-03 | Discharge: 2015-02-03 | Disposition: A | Discharge status: Home / Self Care | Payer: 59 | Source: Ambulatory Visit | Attending: Internal Medicine | Admitting: Internal Medicine

## 2015-02-03 DIAGNOSIS — M5126 Other intervertebral disc displacement, lumbar region: Secondary | ICD-10-CM | POA: Insufficient documentation

## 2015-02-03 DIAGNOSIS — M4806 Spinal stenosis, lumbar region: Secondary | ICD-10-CM | POA: Insufficient documentation

## 2015-02-03 DIAGNOSIS — M5442 Lumbago with sciatica, left side: Secondary | ICD-10-CM | POA: Insufficient documentation

## 2015-02-04 ENCOUNTER — Ambulatory Visit (HOSPITAL_COMMUNITY): Payer: 59

## 2015-02-04 ENCOUNTER — Other Ambulatory Visit: Payer: Self-pay | Admitting: Internal Medicine

## 2015-02-04 DIAGNOSIS — M48061 Spinal stenosis, lumbar region without neurogenic claudication: Secondary | ICD-10-CM

## 2015-02-05 ENCOUNTER — Other Ambulatory Visit: Payer: 59

## 2015-02-08 ENCOUNTER — Ambulatory Visit: Payer: 59

## 2015-02-10 HISTORY — PX: BACK SURGERY: SHX140

## 2015-05-04 ENCOUNTER — Ambulatory Visit
Admission: RE | Admit: 2015-05-04 | Discharge: 2015-05-04 | Disposition: A | Discharge status: Home / Self Care | Payer: 59 | Source: Ambulatory Visit | Attending: Internal Medicine | Admitting: Internal Medicine

## 2015-05-04 DIAGNOSIS — Z1239 Encounter for other screening for malignant neoplasm of breast: Secondary | ICD-10-CM

## 2015-05-04 DIAGNOSIS — R928 Other abnormal and inconclusive findings on diagnostic imaging of breast: Secondary | ICD-10-CM | POA: Diagnosis not present

## 2015-05-04 DIAGNOSIS — Z1231 Encounter for screening mammogram for malignant neoplasm of breast: Secondary | ICD-10-CM | POA: Diagnosis present

## 2015-05-13 ENCOUNTER — Telehealth: Payer: Self-pay | Admitting: Internal Medicine

## 2015-05-13 NOTE — Telephone Encounter (Signed)
Patient called to find out the results of her mammogram.

## 2015-05-13 NOTE — Telephone Encounter (Signed)
Looks like they mailed her letter stating she needed additional testing---it is in chart---would like to review and advise before I call pt back

## 2015-05-13 NOTE — Telephone Encounter (Signed)
The final report is not back. The radiologist will notify here once the mammogram is finalized.

## 2015-05-14 NOTE — Telephone Encounter (Signed)
Just call her and tell her that they saw a possible mass in the right breast. They are going to contact her to get additional imaging with an ultrasound.

## 2015-05-14 NOTE — Telephone Encounter (Signed)
Melanie faxed this today

## 2015-05-14 NOTE — Telephone Encounter (Signed)
Patient called and said she [facility].  Patient said they told her she can't make an appointment until they receive an order from [facility].  Please call patient at [phone].

## 2015-05-17 ENCOUNTER — Other Ambulatory Visit: Payer: Self-pay | Admitting: Internal Medicine

## 2015-05-17 DIAGNOSIS — N6489 Other specified disorders of breast: Secondary | ICD-10-CM

## 2015-05-17 DIAGNOSIS — N63 Unspecified lump in unspecified breast: Secondary | ICD-10-CM

## 2015-05-24 ENCOUNTER — Ambulatory Visit
Admission: RE | Admit: 2015-05-24 | Discharge: 2015-05-24 | Disposition: A | Discharge status: Home / Self Care | Payer: 59 | Source: Ambulatory Visit | Attending: Internal Medicine | Admitting: Internal Medicine

## 2015-05-24 DIAGNOSIS — N63 Unspecified lump in unspecified breast: Secondary | ICD-10-CM

## 2015-05-24 DIAGNOSIS — R928 Other abnormal and inconclusive findings on diagnostic imaging of breast: Secondary | ICD-10-CM | POA: Insufficient documentation

## 2015-05-24 DIAGNOSIS — N6489 Other specified disorders of breast: Secondary | ICD-10-CM

## 2015-08-03 ENCOUNTER — Ambulatory Visit (HOSPITAL_COMMUNITY)
Admission: EM | Admit: 2015-08-03 | Discharge: 2015-08-03 | Disposition: A | Discharge status: Home / Self Care | Payer: 59 | Attending: Physician Assistant | Admitting: Physician Assistant

## 2015-08-03 ENCOUNTER — Encounter (HOSPITAL_COMMUNITY): Payer: Self-pay | Admitting: Emergency Medicine

## 2015-08-03 DIAGNOSIS — Z029 Encounter for administrative examinations, unspecified: Secondary | ICD-10-CM | POA: Insufficient documentation

## 2015-08-03 DIAGNOSIS — L0390 Cellulitis, unspecified: Secondary | ICD-10-CM

## 2015-08-03 DIAGNOSIS — L0291 Cutaneous abscess, unspecified: Secondary | ICD-10-CM

## 2015-08-03 MED ORDER — SULFAMETHOXAZOLE-TRIMETHOPRIM 800-160 MG PO TABS: 1.0000 | ORAL_TABLET | Freq: Two times a day (BID) | ORAL | 0 refills | Status: AC

## 2015-08-03 NOTE — ED Triage Notes (Signed)
The patient presented to the [facility] with a complaint of upper left quadrant abdominal pain that started 4 days prior. The patient reported a "mass" under the skin that has been there for over 1 year but has gotten larger and is now painful.

## 2015-08-05 ENCOUNTER — Ambulatory Visit: Payer: 59 | Admitting: Internal Medicine

## 2015-08-06 LAB — AEROBIC CULTURE W GRAM STAIN (SUPERFICIAL SPECIMEN)

## 2015-08-06 LAB — AEROBIC CULTURE  (SUPERFICIAL SPECIMEN): CULTURE: NORMAL

## 2015-08-10 ENCOUNTER — Telehealth: Payer: Self-pay | Admitting: Internal Medicine

## 2015-08-10 ENCOUNTER — Encounter: Payer: Self-pay | Admitting: Internal Medicine

## 2015-08-10 NOTE — Telephone Encounter (Signed)
[facility] - Client  TELEPHONE ADVICE RECORD   [facility]    --------------------------------------------------------------------------------   Patient Name: Jill Gay  Gender: Female  DOB: Sep 08, 1965   Age: 50 Y 7 M 19 D  Return Phone Number: [phone] (Primary)  Address:     City/State/Zip:  [place]     Client [facility] - Client  Client Site [facility] - Day  Physician Nicki Reaper - NP  Contact Type Call  Who Is Calling Patient / Member / Family / Caregiver  Call Type Triage / Clinical  Caller Name Jaylaa  Relationship To Patient Self  Return Phone Number [phone] (Primary)  Chief Complaint Headache  Reason for Call Symptomatic / Request for Health Information  Initial Comment Caller states she had an infection removed a week ago - She believes it was staph. She was placed on an ABX, and she believes she is now having a reaction. Headache, fever - current unknown, skin is red, hot to the touch, but feels cold, and no appetite.   Appointment Disposition EMR Patient Reports Appointment Already Scheduled  Info pasted into Epic Yes  PreDisposition Call Doctor  Translation No       Nurse Assessment  Nurse: Logan Bores, RN, Melissa Date/Time (Eastern Time): 08/10/2015 3:32:11 PM  Confirm and document reason for call. If symptomatic, describe symptoms. You must click the next button to save text entered. ---Caller states she had an infection removed a week ago - She believes it was staph. She was placed on an ABX, and she believes she is now having a reaction. Headache, fever - current unknown, skin is red, hot to the touch, but feels cold, and no appetite.    Has the patient traveled out of the country within the last 30 days? ---Not Applicable    Does the patient have any new or worsening symptoms? ---Yes    Will a triage be completed? ---Yes    Related visit to physician  within the last 2 weeks? ---Yes    Does the PT have any chronic conditions? (i.e. diabetes, asthma, etc.) ---No    Is the patient pregnant or possibly pregnant? (Ask all females between the ages of 82-55) ---No    Is this a behavioral health or substance abuse call? ---No           Guidelines          Guideline Title Affirmed Question Affirmed Notes Nurse Date/Time (Eastern Time)  Headache [1] MODERATE headache (e.g., interferes with normal activities) AND [2] present > 24 hours AND [3] unexplained (Exceptions: analgesics not tried, typical migraine, or headache part of viral illness)    Logan Bores, RN, Melissa 08/10/2015 3:34:37 PM    Disp. Time Lamount Cohen Time) Disposition Final User         08/10/2015 3:37:59 PM See Physician within 24 Hours Yes Logan Bores, RN, Efraim Kaufmann            Caller Understands: Yes  Disagree/Comply: Comply       Care Advice Given Per Guideline        SEE PHYSICIAN WITHIN 24 HOURS: LOCAL COLD: Apply a cold wet washcloth or cold pack to the forehead for 20 minutes REST: Lie down in a Gay quiet place and relax until feeling better. CALL BACK IF: * You become worse.        --------------------------------------------------------------------------------         Comments  User: Efraim Kaufmann,  Logan Bores, RN Date/Time (Eastern Time): 08/10/2015 3:39:36 PM  Caller reports she has one more antibiotic to take, recommended she complete the antibiotics as ordered. Caller reports she has an appt already scheduled for tomorrow with her doctor.    Referrals  REFERRED TO PCP OFFICE

## 2015-08-10 NOTE — Telephone Encounter (Signed)
Pt has appt scheduled 08/11/15 at 10:45 with Mayra Reel NP.

## 2015-08-10 NOTE — Telephone Encounter (Signed)
Noted  

## 2015-08-11 ENCOUNTER — Ambulatory Visit (INDEPENDENT_AMBULATORY_CARE_PROVIDER_SITE_OTHER): Payer: 59 | Admitting: Primary Care

## 2015-08-11 ENCOUNTER — Encounter: Payer: Self-pay | Admitting: Primary Care

## 2015-08-11 VITALS — BP 116/76 | HR 79 | Temp 98.3°F | Ht 64.0 in | Wt 132.1 lb

## 2015-08-11 DIAGNOSIS — L0291 Cutaneous abscess, unspecified: Secondary | ICD-10-CM | POA: Diagnosis not present

## 2015-08-11 DIAGNOSIS — L0390 Cellulitis, unspecified: Secondary | ICD-10-CM

## 2015-08-11 MED ORDER — DOXYCYCLINE HYCLATE 100 MG PO TABS: 100.0000 mg | ORAL_TABLET | Freq: Two times a day (BID) | ORAL | 0 refills | Status: DC

## 2015-08-11 NOTE — Patient Instructions (Signed)
The infection has not quite cleared and will require additional antibiotics. Start Doxycycline antibiotic. Take 1 tablet by mouth twice daily for 7 days.  Apply warm compresses twice daily to allow the site to drain.   Please call Rene Kocher if no improvement after completion of antibiotics.   It was a pleasure meeting you!

## 2015-08-11 NOTE — Progress Notes (Signed)
Subjective:    Patient ID: Jill Gay, female    DOB: 1965-02-23, 50 y.o.   MRN: [id number]  HPI  Jill Gay is a 50 year old female who presents today for follow up of abscess and cellulitis. She presented to [facility] on 07/25 with a chief complaint of a "knot" tothe skin of her left upper abdomen. She had developed increased inflammation and erythema to that area for the last several days. She was diagnosed with an abscess for which was lanced and packed. She was placed on Bactrim DS for a 7 day course.   She noticed symptoms of her skin burning, chills, facial swelling, fevers, erythema after day 4 of the antibiotics. She continued taking antibiotics and completed the Bactrim tablets last night. She has since pull out the packing from the abscess. Today she woke up today feeling much improved from the symptoms she noticed after starting Bactrim. She did not take anything for her symptoms including Benadryl or another antihistamine.   Overall she feels as though her skin infection is healing. No fevers since yesterday. She does have remains of a headache and erythema to her upper extremities and trunk.  Review of Systems  Constitutional: Negative for fever.  HENT: Negative for sore throat and trouble swallowing.   Respiratory: Negative for shortness of breath and wheezing.   Cardiovascular: Negative for chest pain.  Skin:       Erythema to the extremities and trunk. Wound to left upper abdomen.  Neurological: Positive for headaches.       Past Medical History:  Diagnosis Date  . CHICKENPOX, HX OF   . Gestational hypertension   . PUD (peptic ulcer disease)   . VENEREAL WART      Social History   Social History  . Marital status: Married    Spouse name: N/A  . Number of children: N/A  . Years of education: N/A   Occupational History  . Not on file.   Social History Main Topics  . Smoking status: Former Games developer  . Smokeless tobacco: Never Used   Comment: quit in 2016  . Alcohol use 0.0 oz/week     Comment: social  . Drug use: No  . Sexual activity: Yes   Other Topics Concern  . Not on file   Social History Narrative  . No narrative on file    Past Surgical History:  Procedure Laterality Date  . PELVIC TUMOR REMOVED  2006    Family History  Problem Relation Age of Onset  . Alcohol abuse Mother   . Arthritis Mother   . Depression Mother   . Melanoma Sister   . Alcohol abuse Maternal Grandmother   . Arthritis Maternal Grandmother   . Heart disease Maternal Grandmother   . Stroke Maternal Grandmother     Allergies  Allergen Reactions  . Bactrim [Sulfamethoxazole-Trimethoprim] Swelling    Blisters, erythema to skin    Current Outpatient Prescriptions on File Prior to Visit  Medication Sig Dispense Refill  . HYDROcodone-acetaminophen (NORCO/VICODIN) 5-325 MG tablet Take 1-2 tablets by mouth every 8 (eight) hours as needed for moderate pain. (Patient not taking: Reported on 08/11/2015) 180 tablet 0  . methocarbamol (ROBAXIN) 500 MG tablet Take 1 tablet (500 mg total) by mouth at bedtime as needed for muscle spasms. (Patient not taking: Reported on 08/11/2015) 20 tablet 0  . predniSONE (DELTASONE) 20 MG tablet Take 3 tabs on days 1-3, take 2 tabs on days 4-6, take 1 tab  on days 7-9 (Patient not taking: Reported on 08/11/2015) 18 tablet 0   No current facility-administered medications on file prior to visit.     BP 116/76   Pulse 79   Temp 98.3 F (36.8 C) (Oral)   Ht 5\' 4"  (1.626 m)   Wt 132 lb 1.9 oz (59.9 kg)   SpO2 99%   BMI 22.68 kg/m    Objective:   Physical Exam  Constitutional: She appears well-nourished.  Cardiovascular: Normal rate and regular rhythm.   Pulmonary/Chest: Effort normal and breath sounds normal.  Skin:  2 cm superficial healing abscess to left upper abdomen with evidence of remaining abscess to dermis. Site with mild erythema and firm abscess or deep to site. Nontender, no drainage.           Assessment & Plan:  Abscess and cellulitis:  Diagnosed and treated 1 week ago for abscess and cellulitis to left upper abdomen per urgent care. Experienced allergic reaction to Bactrim DS tablets, but continued to take. Finished antibiotic last night and is feeling much better today in terms of reaction. Exam today with incomplete healing of abscess to left upper abdomen which will likely require additional antibiotics. Bactrim added to allergy list. Prescription for doxycycline seven-day course sent to pharmacy. Discussed the application of warm compresses to allow drainage. She will follow-up with PCP or myself if no improvement in 3-4 days.  Morrie Sheldon, NP

## 2015-08-11 NOTE — Progress Notes (Signed)
Pre visit review using our clinic review tool, if applicable. No additional management support is needed unless otherwise documented below in the visit note. 

## 2015-08-12 ENCOUNTER — Telehealth: Payer: Self-pay

## 2015-08-12 NOTE — Telephone Encounter (Signed)
Pt left /vm; pt was seen 08/11/15 and given abx; this morning pt has raised, red and itchy rash on torso. Please advise.

## 2015-08-12 NOTE — Telephone Encounter (Signed)
Please have patient stop antibiotic, take 2 benadryl tablets now. Have her call back later if continued rash. Please schedule her with PCP for tomorrow if no improvement by the end of the day today, or straight to the emergency department if she develops shortness of breath, wheezing, throat tightness. Continue warm compresses to site of abscess for now. Will cc PCP for an FYI.

## 2015-08-12 NOTE — Telephone Encounter (Signed)
Spoken and notified patient of Kate's comments. Patient verbalized understanding. 

## 2015-10-21 ENCOUNTER — Encounter: Payer: Self-pay | Admitting: Internal Medicine

## 2015-10-21 ENCOUNTER — Ambulatory Visit (INDEPENDENT_AMBULATORY_CARE_PROVIDER_SITE_OTHER): Payer: 59 | Admitting: Internal Medicine

## 2015-10-21 VITALS — BP 114/78 | HR 67 | Temp 98.0°F | Ht 64.0 in | Wt 128.5 lb

## 2015-10-21 DIAGNOSIS — Z0001 Encounter for general adult medical examination with abnormal findings: Secondary | ICD-10-CM

## 2015-10-21 DIAGNOSIS — Z0000 Encounter for general adult medical examination without abnormal findings: Secondary | ICD-10-CM

## 2015-10-21 DIAGNOSIS — Z23 Encounter for immunization: Secondary | ICD-10-CM

## 2015-10-21 LAB — COMPREHENSIVE METABOLIC PANEL
ALBUMIN: 4.6 g/dL (ref 3.5–5.2)
ALK PHOS: 44 U/L (ref 39–117)
ALT: 13 U/L (ref 0–35)
AST: 16 U/L (ref 0–37)
BUN: 18 mg/dL (ref 6–23)
CHLORIDE: 103 meq/L (ref 96–112)
CO2: 32 mEq/L (ref 19–32)
CREATININE: 0.82 mg/dL (ref 0.40–1.20)
Calcium: 9.9 mg/dL (ref 8.4–10.5)
GFR: 79.14 mL/min (ref >60.00)
GLUCOSE: 96 mg/dL (ref 70–99)
Potassium: 4.2 mEq/L (ref 3.5–5.1)
SODIUM: 141 meq/L (ref 135–145)
TOTAL PROTEIN: 7.3 g/dL (ref 6.0–8.3)
Total Bilirubin: 0.6 mg/dL (ref 0.2–1.2)

## 2015-10-21 LAB — LIPID PANEL
CHOLESTEROL: 186 mg/dL (ref 0–200)
HDL: 77.1 mg/dL (ref >39.00)
LDL CALC: 101 mg/dL — AB (ref 0–99)
NONHDL: 109.15
Total CHOL/HDL Ratio: 2
Triglycerides: 40 mg/dL (ref 0.0–149.0)
VLDL: 8 mg/dL (ref 0.0–40.0)

## 2015-10-21 LAB — CBC
HCT: 40.8 % (ref 36.0–46.0)
Hemoglobin: 13.9 g/dL (ref 12.0–15.0)
MCHC: 34 g/dL (ref 30.0–36.0)
MCV: 93.7 fl (ref 78.0–100.0)
Platelets: 185 10*3/uL (ref 150.0–400.0)
RBC: 4.36 Mil/uL (ref 3.87–5.11)
RDW: 12.6 % (ref 11.5–15.5)
WBC: 3.3 10*3/uL — AB (ref 4.0–10.5)

## 2015-10-21 NOTE — Progress Notes (Addendum)
HPI  Pt presents to the clinic today for her annual exam.   Flu: 09/2014 Tetanus: 11/2010 Pap Smear: 2015 at [facility] Mammogram: 05/2015, she is perimenopausal, LMP 06/2015. Some mood swings, hot flashes. Vision Screening: 06/2015, yearly Dentist: yearly  Diet: She consumes a THM, low carb, low sugars. She does eat meat, lots of veggies, no fruits. She tries to avoid fried foods. She drinks mostly water. Exercise: She likes to run, but it has been hard since her back surgery..  She did have low back surgery for L5 nerve impingement. She reports she still has low back pain with numbness and cramping of the left lower extremity.   Past Medical History:  Diagnosis Date  . CHICKENPOX, HX OF   . Gestational hypertension   . PUD (peptic ulcer disease)   . VENEREAL WART     Current Outpatient Prescriptions  Medication Sig Dispense Refill  . doxycycline (VIBRA-TABS) 100 MG tablet Take 1 tablet (100 mg total) by mouth 2 (two) times daily. 14 tablet 0  . HYDROcodone-acetaminophen (NORCO/VICODIN) 5-325 MG tablet Take 1-2 tablets by mouth every 8 (eight) hours as needed for moderate pain. (Patient not taking: Reported on 08/11/2015) 180 tablet 0  . methocarbamol (ROBAXIN) 500 MG tablet Take 1 tablet (500 mg total) by mouth at bedtime as needed for muscle spasms. (Patient not taking: Reported on 08/11/2015) 20 tablet 0  . predniSONE (DELTASONE) 20 MG tablet Take 3 tabs on days 1-3, take 2 tabs on days 4-6, take 1 tab on days 7-9 (Patient not taking: Reported on 08/11/2015) 18 tablet 0   No current facility-administered medications for this visit.     Allergies  Allergen Reactions  . Bactrim [Sulfamethoxazole-Trimethoprim] Swelling    Blisters, erythema to skin    Family History  Problem Relation Age of Onset  . Alcohol abuse Mother   . Arthritis Mother   . Depression Mother   . Melanoma Sister   . Alcohol abuse Maternal Grandmother   . Arthritis Maternal Grandmother   . Heart disease  Maternal Grandmother   . Stroke Maternal Grandmother     Social History   Social History  . Marital status: Married    Spouse name: N/A  . Number of children: N/A  . Years of education: N/A   Occupational History  . Not on file.   Social History Main Topics  . Smoking status: Former Games developer  . Smokeless tobacco: Never Used     Comment: quit in 2016  . Alcohol use 0.0 oz/week     Comment: social  . Drug use: No  . Sexual activity: Yes   Other Topics Concern  . Not on file   Social History Narrative  . No narrative on file    ROS:  Constitutional: Denies fever, malaise, fatigue, headache or abrupt weight changes.  HEENT: Denies eye pain, eye redness, ear pain, ringing in the ears, wax buildup, runny nose, nasal congestion, bloody nose, or sore throat. Respiratory: Denies difficulty breathing, shortness of breath, cough or sputum production.   Cardiovascular: Denies chest pain, chest tightness, palpitations or swelling in the hands or feet.  Gastrointestinal: Denies abdominal pain, bloating, constipation, diarrhea or blood in the stool.  GU: Pt reports urge incontinence. Denies frequency, urgency, pain with urination, blood in urine, odor or discharge. Musculoskeletal: Pt reports low back pain. Denies decrease in range of motion, difficulty with gait, muscle pain or joint swelling.  Skin: Denies redness, rashes, lesions or ulcercations.  Neurological: Pt reports numbness  of her left lower extremity. Denies dizziness, difficulty with memory, difficulty with speech or problems with balance and coordination.  Psych: Denies anxiety, depression, SI/HI.  No other specific complaints in a complete review of systems (except as listed in HPI above).  PE:  BP 114/78   Pulse 67   Temp 98 F (36.7 C) (Oral)   Ht 5\' 4"  (1.626 m)   Wt 128 lb 8 oz (58.3 kg)   SpO2 99%   BMI 22.06 kg/m    Wt Readings from Last 3 Encounters:  08/11/15 132 lb 1.9 oz (59.9 kg)  08/03/15 128 lb  (58.1 kg)  02/03/15 122 lb (55.3 kg)    General: Appears her stated age, well developed, well nourished in NAD. Skin: 2 in scar noted over lumbar spine.  HEENT: Head: normal shape and size; Eyes: sclera white, no icterus, conjunctiva pink, PERRLA and EOMs intact; Ears: Tm's gray and intact, normal light reflex;Throat/Mouth: Teeth present, mucosa pink and moist, no lesions or ulcerations noted.  Neck: Neck supple, trachea midline. No masses, lumps or thyromegaly present.  Cardiovascular: Normal rate and rhythm. S1,S2 noted.  No murmur, rubs or gallops noted. No JVD or BLE edema. No carotid bruits noted. Pulmonary/Chest: Normal effort and positive vesicular breath sounds. No respiratory distress. No wheezes, rales or ronchi noted.  Abdomen: Soft and nontender. Normal bowel sounds. No distention or masses noted.  Musculoskeletal: Normal flexion, extension and rotation of the spine. Bony tenderness noted over the lumbar spine. Strength 5/5 BUE. Strength 5/5 RLE, 4/5 LLE. No signs of joint swelling. No difficulty with gait.  Neurological: Alert and oriented. Cranial nerves II-XII grossly intact. Coordination normal.  Psychiatric: Mood and affect normal. Behavior is normal. Judgment and thought content normal.    BMET    Component Value Date/Time   NA 143 10/19/2014 0959   K 4.1 10/19/2014 0959   CL 106 10/19/2014 0959   CO2 31 10/19/2014 0959   GLUCOSE 97 10/19/2014 0959   BUN 9 10/19/2014 0959   CREATININE 0.78 10/19/2014 0959   CALCIUM 9.2 10/19/2014 0959   GFRNONAA 84.20 07/15/2008 0846    Lipid Panel     Component Value Date/Time   CHOL 166 10/19/2014 0959   TRIG 92.0 10/19/2014 0959   HDL 69.10 10/19/2014 0959   CHOLHDL 2 10/19/2014 0959   VLDL 18.4 10/19/2014 0959   LDLCALC 79 10/19/2014 0959    CBC    Component Value Date/Time   WBC 3.6 (L) 11/02/2014 0833   RBC 4.38 11/02/2014 0833   HGB 14.0 11/02/2014 0833   HCT 41.9 11/02/2014 0833   PLT 173.0 11/02/2014 0833    MCV 95.7 11/02/2014 0833   MCHC 33.4 11/02/2014 0833   RDW 12.1 11/02/2014 0833   LYMPHSABS 1.1 11/02/2014 0833   MONOABS 0.3 11/02/2014 0833   EOSABS 0.1 11/02/2014 0833   BASOSABS 0.0 11/02/2014 0833    Hgb A1C No results found for: HGBA1C   Assessment and Plan:  Preventative Health Maintenance:  Flu shot today Tetanus UTD Pap Smear due 2018 Mammogram UTD Encouraged her to continue to consume a healthy diet and exercise regimen Encouraged her to see an eye doctor and dentist annually Will check CBC, CMET and Lipid Profile today  Urge incontinence:  She is not interested in an anticholinergic medication at this time Discussed voiding every 2 hours to reduce the urge  RTC in 1 year for your annual exam Nicki ReaperBAITY, Nissan Frazzini, NP

## 2015-10-21 NOTE — Patient Instructions (Signed)
Health Maintenance, Female Adopting a healthy lifestyle and getting preventive care can go a long way to promote health and wellness. Talk with your health care provider about what schedule of regular examinations is right for you. This is a good chance for you to check in with your provider about disease prevention and staying healthy. In between checkups, there are plenty of things you can do on your own. Experts have done a lot of research about which lifestyle changes and preventive measures are most likely to keep you healthy. Ask your health care provider for more information. WEIGHT AND DIET  Eat a healthy diet  Be sure to include plenty of vegetables, fruits, low-fat dairy products, and lean protein.  Do not eat a lot of foods high in solid fats, added sugars, or salt.  Get regular exercise. This is one of the most important things you can do for your health.  Most adults should exercise for at least 150 minutes each week. The exercise should increase your heart rate and make you sweat (moderate-intensity exercise).  Most adults should also do strengthening exercises at least twice a week. This is in addition to the moderate-intensity exercise.  Maintain a healthy weight  Body mass index (BMI) is a measurement that can be used to identify possible weight problems. It estimates body fat based on height and weight. Your health care provider can help determine your BMI and help you achieve or maintain a healthy weight.  For females 20 years of age and older:   A BMI below 18.5 is considered underweight.  A BMI of 18.5 to 24.9 is normal.  A BMI of 25 to 29.9 is considered overweight.  A BMI of 30 and above is considered obese.  Watch levels of cholesterol and blood lipids  You should start having your blood tested for lipids and cholesterol at 48 years of age, then have this test every 5 years.  You may need to have your cholesterol levels checked more often if:  Your lipid  or cholesterol levels are high.  You are older than 48 years of age.  You are at high risk for heart disease.  CANCER SCREENING   Lung Cancer  Lung cancer screening is recommended for adults 55-80 years old who are at high risk for lung cancer because of a history of smoking.  A yearly low-dose CT scan of the lungs is recommended for people who:  Currently smoke.  Have quit within the past 15 years.  Have at least a 30-pack-year history of smoking. A pack year is smoking an average of one pack of cigarettes a day for 1 year.  Yearly screening should continue until it has been 15 years since you quit.  Yearly screening should stop if you develop a health problem that would prevent you from having lung cancer treatment.  Breast Cancer  Practice breast self-awareness. This means understanding how your breasts normally appear and feel.  It also means doing regular breast self-exams. Let your health care provider know about any changes, no matter how small.  If you are in your 20s or 30s, you should have a clinical breast exam (CBE) by a health care provider every 1-3 years as part of a regular health exam.  If you are 40 or older, have a CBE every year. Also consider having a breast X-ray (mammogram) every year.  If you have a family history of breast cancer, talk to your health care provider about genetic screening.  If you   are at high risk for breast cancer, talk to your health care provider about having an MRI and a mammogram every year.  Breast cancer gene (BRCA) assessment is recommended for women who have family members with BRCA-related cancers. BRCA-related cancers include:  Breast.  Ovarian.  Tubal.  Peritoneal cancers.  Results of the assessment will determine the need for genetic counseling and BRCA1 and BRCA2 testing. Cervical Cancer Your health care provider may recommend that you be screened regularly for cancer of the pelvic organs (ovaries, uterus, and  vagina). This screening involves a pelvic examination, including checking for microscopic changes to the surface of your cervix (Pap test). You may be encouraged to have this screening done every 3 years, beginning at age 21.  For women ages 30-65, health care providers may recommend pelvic exams and Pap testing every 3 years, or they may recommend the Pap and pelvic exam, combined with testing for human papilloma virus (HPV), every 5 years. Some types of HPV increase your risk of cervical cancer. Testing for HPV may also be done on women of any age with unclear Pap test results.  Other health care providers may not recommend any screening for nonpregnant women who are considered low risk for pelvic cancer and who do not have symptoms. Ask your health care provider if a screening pelvic exam is right for you.  If you have had past treatment for cervical cancer or a condition that could lead to cancer, you need Pap tests and screening for cancer for at least 20 years after your treatment. If Pap tests have been discontinued, your risk factors (such as having a new sexual partner) need to be reassessed to determine if screening should resume. Some women have medical problems that increase the chance of getting cervical cancer. In these cases, your health care provider may recommend more frequent screening and Pap tests. Colorectal Cancer  This type of cancer can be detected and often prevented.  Routine colorectal cancer screening usually begins at 48 years of age and continues through 48 years of age.  Your health care provider may recommend screening at an earlier age if you have risk factors for colon cancer.  Your health care provider may also recommend using home test kits to check for hidden blood in the stool.  A small camera at the end of a tube can be used to examine your colon directly (sigmoidoscopy or colonoscopy). This is done to check for the earliest forms of colorectal  cancer.  Routine screening usually begins at age 50.  Direct examination of the colon should be repeated every 5-10 years through 48 years of age. However, you may need to be screened more often if early forms of precancerous polyps or small growths are found. Skin Cancer  Check your skin from head to toe regularly.  Tell your health care provider about any new moles or changes in moles, especially if there is a change in a mole's shape or color.  Also tell your health care provider if you have a mole that is larger than the size of a pencil eraser.  Always use sunscreen. Apply sunscreen liberally and repeatedly throughout the day.  Protect yourself by wearing long sleeves, pants, a wide-brimmed hat, and sunglasses whenever you are outside. HEART DISEASE, DIABETES, AND HIGH BLOOD PRESSURE   High blood pressure causes heart disease and increases the risk of stroke. High blood pressure is more likely to develop in:  People who have blood pressure in the high end   of the normal range (130-139/85-89 mm Hg).  People who are overweight or obese.  People who are African American.  If you are 38-23 years of age, have your blood pressure checked every 3-5 years. If you are 61 years of age or older, have your blood pressure checked every year. You should have your blood pressure measured twice--once when you are at a hospital or clinic, and once when you are not at a hospital or clinic. Record the average of the two measurements. To check your blood pressure when you are not at a hospital or clinic, you can use:  An automated blood pressure machine at a pharmacy.  A home blood pressure monitor.  If you are between 45 years and 39 years old, ask your health care provider if you should take aspirin to prevent strokes.  Have regular diabetes screenings. This involves taking a blood sample to check your fasting blood sugar level.  If you are at a normal weight and have a low risk for diabetes,  have this test once every three years after 48 years of age.  If you are overweight and have a high risk for diabetes, consider being tested at a younger age or more often. PREVENTING INFECTION  Hepatitis B  If you have a higher risk for hepatitis B, you should be screened for this virus. You are considered at high risk for hepatitis B if:  You were born in a country where hepatitis B is common. Ask your health care provider which countries are considered high risk.  Your parents were born in a high-risk country, and you have not been immunized against hepatitis B (hepatitis B vaccine).  You have HIV or AIDS.  You use needles to inject street drugs.  You live with someone who has hepatitis B.  You have had sex with someone who has hepatitis B.  You get hemodialysis treatment.  You take certain medicines for conditions, including cancer, organ transplantation, and autoimmune conditions. Hepatitis C  Blood testing is recommended for:  Everyone born from [birth year] through 1963.  Anyone with known risk factors for hepatitis C. Sexually transmitted infections (STIs)  You should be screened for sexually transmitted infections (STIs) including gonorrhea and chlamydia if:  You are sexually active and are younger than 48 years of age.  You are older than 48 years of age and your health care provider tells you that you are at risk for this type of infection.  Your sexual activity has changed since you were last screened and you are at an increased risk for chlamydia or gonorrhea. Ask your health care provider if you are at risk.  If you do not have HIV, but are at risk, it may be recommended that you take a prescription medicine daily to prevent HIV infection. This is called pre-exposure prophylaxis (PrEP). You are considered at risk if:  You are sexually active and do not regularly use condoms or know the HIV status of your partner(s).  You take drugs by injection.  You are sexually  active with a partner who has HIV. Talk with your health care provider about whether you are at high risk of being infected with HIV. If you choose to begin PrEP, you should first be tested for HIV. You should then be tested every 3 months for as long as you are taking PrEP.  PREGNANCY   If you are premenopausal and you may become pregnant, ask your health care provider about preconception counseling.  If you may  become pregnant, take 400 to 800 micrograms (mcg) of folic acid every day.  If you want to prevent pregnancy, talk to your health care provider about birth control (contraception). OSTEOPOROSIS AND MENOPAUSE   Osteoporosis is a disease in which the bones lose minerals and strength with aging. This can result in serious bone fractures. Your risk for osteoporosis can be identified using a bone density scan.  If you are 61 years of age or older, or if you are at risk for osteoporosis and fractures, ask your health care provider if you should be screened.  Ask your health care provider whether you should take a calcium or vitamin D supplement to lower your risk for osteoporosis.  Menopause may have certain physical symptoms and risks.  Hormone replacement therapy may reduce some of these symptoms and risks. Talk to your health care provider about whether hormone replacement therapy is right for you.  HOME CARE INSTRUCTIONS   Schedule regular health, dental, and eye exams.  Stay current with your immunizations.   Do not use any tobacco products including cigarettes, chewing tobacco, or electronic cigarettes.  If you are pregnant, do not drink alcohol.  If you are breastfeeding, limit how much and how often you drink alcohol.  Limit alcohol intake to no more than 1 drink per day for nonpregnant women. One drink equals 12 ounces of beer, 5 ounces of wine, or 1 ounces of hard liquor.  Do not use street drugs.  Do not share needles.  Ask your health care provider for help if  you need support or information about quitting drugs.  Tell your health care provider if you often feel depressed.  Tell your health care provider if you have ever been abused or do not feel safe at home.   This information is not intended to replace advice given to you by your health care provider. Make sure you discuss any questions you have with your health care provider.   Document Released: 07/11/2010 Document Revised: 01/16/2014 Document Reviewed: 11/27/2012 Elsevier Interactive Patient Education [employer].

## 2015-10-21 NOTE — Addendum Note (Signed)
Addended by: Roena MaladyEVONTENNO, Vittoria Noreen Y on: 10/21/2015 09:04 AM   Modules accepted: Orders

## 2015-10-29 ENCOUNTER — Encounter: Payer: Self-pay | Admitting: Internal Medicine

## 2015-11-01 ENCOUNTER — Other Ambulatory Visit: Payer: Self-pay | Admitting: Internal Medicine

## 2015-11-01 DIAGNOSIS — D72819 Decreased white blood cell count, unspecified: Secondary | ICD-10-CM

## 2015-11-17 ENCOUNTER — Inpatient Hospital Stay: Payer: 59 | Attending: Hematology and Oncology | Admitting: Hematology and Oncology

## 2015-11-17 ENCOUNTER — Encounter: Payer: Self-pay | Admitting: Hematology and Oncology

## 2015-11-17 ENCOUNTER — Telehealth: Payer: Self-pay

## 2015-11-17 ENCOUNTER — Inpatient Hospital Stay: Payer: 59

## 2015-11-17 DIAGNOSIS — D72819 Decreased white blood cell count, unspecified: Secondary | ICD-10-CM | POA: Insufficient documentation

## 2015-11-17 DIAGNOSIS — Z8711 Personal history of peptic ulcer disease: Secondary | ICD-10-CM | POA: Diagnosis not present

## 2015-11-17 DIAGNOSIS — A630 Anogenital (venereal) warts: Secondary | ICD-10-CM | POA: Insufficient documentation

## 2015-11-17 DIAGNOSIS — Z87891 Personal history of nicotine dependence: Secondary | ICD-10-CM

## 2015-11-17 LAB — CBC WITH DIFFERENTIAL/PLATELET
Basophils Absolute: 0 10*3/uL (ref 0–0.1)
Basophils Relative: 1 %
Eosinophils Absolute: 0 10*3/uL (ref 0–0.7)
Eosinophils Relative: 1 %
HEMATOCRIT: 40.6 % (ref 35.0–47.0)
HEMOGLOBIN: 14 g/dL (ref 12.0–16.0)
LYMPHS ABS: 1.2 10*3/uL (ref 1.0–3.6)
LYMPHS PCT: 24 %
MCH: 32.4 pg (ref 26.0–34.0)
MCHC: 34.5 g/dL (ref 32.0–36.0)
MCV: 93.8 fL (ref 80.0–100.0)
MONO ABS: 0.2 10*3/uL (ref 0.2–0.9)
MONOS PCT: 5 %
NEUTROS ABS: 3.4 10*3/uL (ref 1.4–6.5)
NEUTROS PCT: 69 %
Platelets: 174 10*3/uL (ref 150–440)
RBC: 4.33 MIL/uL (ref 3.80–5.20)
RDW: 12.7 % (ref 11.5–14.5)
WBC: 4.9 10*3/uL (ref 3.6–11.0)

## 2015-11-17 LAB — SEDIMENTATION RATE: Sed Rate: 3 mm/hr (ref 0–20)

## 2015-11-17 LAB — VITAMIN B12: Vitamin B-12: 1176 pg/mL — ABNORMAL HIGH (ref 180–914)

## 2015-11-17 NOTE — Telephone Encounter (Signed)
Return appointment cancelled per Dr Bertis RuddyGorsuch.  Patient notified of normal WBC labs and appointment cancellation.

## 2015-11-17 NOTE — Progress Notes (Signed)
[facility] CONSULT NOTE  Patient Care Team: Lorre Munroeegina W Baity, NP as PCP - General (Internal Medicine) Huel CoteKathy Richardson, MD as Consulting Physician (Obstetrics and Gynecology) Alanson PulsWilliam Turner, MD as Consulting Physician (Dermatology)  CHIEF COMPLAINTS/PURPOSE OF CONSULTATION:  Chronic leukopenia  HISTORY OF PRESENTING ILLNESS:  Jill SicksDarlene Gay 50 y.o. female is here because of recent finding of chronic leukopenia I have the opportunity to review her CBC dated back to 2010. On 07/15/2008, white blood cell count 6.2 On 11/16/2010, white blood cell count 4.7 On 10/19/2014, white blood cell count 3.1 On 11/02/2014, white blood cell count 3.6 On 10/21/2015, white blood cell count 3.3  Recently, she presented to urgent care for evaluation and was found to have abscess. On 08/03/2015, she had drainage of abscess and was prescribed Bactrim She denies history of recurrent infection She had episode of shingles in 2010 which she thought was precipitated by extreme stress She denies risk factors for HIV/hepatitis C She denies abnormal lymphadenopathy, recent cough, recent infection  MEDICAL HISTORY:  Past Medical History:  Diagnosis Date  . CHICKENPOX, HX OF   . Gestational hypertension   . PUD (peptic ulcer disease)   . VENEREAL WART     SURGICAL HISTORY: Past Surgical History:  Procedure Laterality Date  . BACK SURGERY  02/2015  . PELVIC TUMOR REMOVED  2006    SOCIAL HISTORY: Social History   Social History  . Marital status: Married    Spouse name: N/A  . Number of children: 2  . Years of education: N/A   Occupational History  . Not on file.   Social History Main Topics  . Smoking status: Former Games developermoker  . Smokeless tobacco: Never Used     Comment: quit in 2001  . Alcohol use 0.0 oz/week     Comment: social  . Drug use: No  . Sexual activity: Yes   Other Topics Concern  . Not on file   Social History Narrative  . No narrative on file    FAMILY  HISTORY: Family History  Problem Relation Age of Onset  . Alcohol abuse Mother   . Arthritis Mother   . Depression Mother   . Melanoma Sister   . Alcohol abuse Maternal Grandmother   . Arthritis Maternal Grandmother   . Heart disease Maternal Grandmother   . Stroke Maternal Grandmother     ALLERGIES:  is allergic to bactrim [sulfamethoxazole-trimethoprim] and doxycycline.  MEDICATIONS:  Current Outpatient Prescriptions  Medication Sig Dispense Refill  . Biotin 5000 MCG CAPS Take 1 capsule by mouth 2 (two) times daily.    . Black Cohosh 40 MG CAPS Take 1 capsule by mouth daily.    . Cyanocobalamin (VITAMIN B-12) 2500 MCG SUBL Place 1 tablet under the tongue daily.    . Evening Primrose Oil 1000 MG CAPS Take 2-3 capsules by mouth daily.     No current facility-administered medications for this visit.     REVIEW OF SYSTEMS:   Constitutional: Denies fevers, chills or abnormal night sweats Eyes: Denies blurriness of vision, double vision or watery eyes Ears, nose, mouth, throat, and face: Denies mucositis or sore throat Respiratory: Denies cough, dyspnea or wheezes Cardiovascular: Denies palpitation, chest discomfort or lower extremity swelling Gastrointestinal:  Denies nausea, heartburn or change in bowel habits Skin: Denies abnormal skin rashes Lymphatics: Denies new lymphadenopathy or easy bruising Neurological:Denies numbness, tingling or new weaknesses Behavioral/Psych: Mood is stable, no new changes  All other systems were reviewed with the patient and are  negative.  PHYSICAL EXAMINATION: ECOG PERFORMANCE STATUS: 0 - Asymptomatic  Vitals:   11/17/15 1401  BP: (!) 144/91  Pulse: 64  Resp: 16  Temp: 97.9 F (36.6 C)   Filed Weights   11/17/15 1401  Weight: 134 lb 7.7 oz (61 kg)    GENERAL:alert, no distress and comfortable SKIN: skin color, texture, turgor are normal, no rashes or significant lesions EYES: normal, conjunctiva are pink and non-injected, sclera  clear OROPHARYNX:no exudate, no erythema and lips, buccal mucosa, and tongue normal  NECK: supple, thyroid normal size, non-tender, without nodularity LYMPH:  no palpable lymphadenopathy in the cervical, axillary or inguinal LUNGS: clear to auscultation and percussion with normal breathing effort HEART: regular rate & rhythm and no murmurs and no lower extremity edema ABDOMEN:abdomen soft, non-tender and normal bowel sounds Musculoskeletal:no cyanosis of digits and no clubbing  PSYCH: alert & oriented x 3 with fluent speech NEURO: no focal motor/sensory deficits  LABORATORY DATA:  I have reviewed the data as listed Lab Results  Component Value Date   WBC 4.9 11/17/2015   HGB 14.0 11/17/2015   HCT 40.6 11/17/2015   MCV 93.8 11/17/2015   PLT 174 11/17/2015    Recent Labs  10/21/15 0855  NA 141  K 4.2  CL 103  CO2 32  GLUCOSE 96  BUN 18  CREATININE 0.82  CALCIUM 9.9  PROT 7.3  ALBUMIN 4.6  AST 16  ALT 13  ALKPHOS 44  BILITOT 0.6   ASSESSMENT & PLAN:  Chronic leukopenia The cause of the chronic leukopenia is unknown. She is not symptomatic from low chronic leukopenia It could be spuriously result, especially if the tube of blood drawn in the local doctor's office is not run immediately causing possibility of abnormally low WBC Other causes included nutritional deficiency, viral illness such as hepatitis C or HIV or autoimmune disorder I have plan to order all the tests above but at the time of dictation, stat CBC came back with a normal white blood cell count I have cancelled all her her test and cancel her return appointment I reassured the patient that the fluctuation/recent reduced white blood cell count is likely to be spuriously  Orders Placed This Encounter  Procedures  . CBC with Differential/Platelet    Standing Status:   Future    Number of Occurrences:   1    Standing Expiration Date:   12/21/2016  . Vitamin B12    Standing Status:   Future    Number of  Occurrences:   1    Standing Expiration Date:   12/21/2016  . Sedimentation rate    Standing Status:   Future    Number of Occurrences:   1    Standing Expiration Date:   12/21/2016  . ANA w/Reflex if Positive    Standing Status:   Future    Number of Occurrences:   1    Standing Expiration Date:   12/21/2016  . HIV antibody    Standing Status:   Future    Number of Occurrences:   1    Standing Expiration Date:   12/21/2016  . Hepatitis C antibody    Standing Status:   Future    Number of Occurrences:   1    Standing Expiration Date:   12/21/2016    All questions were answered. The patient knows to call the clinic with any problems, questions or concerns. I spent 30 minutes counseling the patient face to face. The total time  spent in the appointment was 40 minutes and more than 50% was on counseling.     Artis Delay, MD 11/17/2015 3:25 PM

## 2015-11-17 NOTE — Assessment & Plan Note (Signed)
The cause of the chronic leukopenia is unknown. She is not symptomatic from low chronic leukopenia It could be spuriously result, especially if the tube of blood drawn in the local doctor's office is not run immediately causing possibility of abnormally low WBC Other causes included nutritional deficiency, viral illness such as hepatitis C or HIV or autoimmune disorder I have plan to order all the tests above but at the time of dictation, stat CBC came back with a normal white blood cell count I have cancelled all her her test and cancel her return appointment I reassured the patient that the fluctuation/recent reduced white blood cell count is likely to be spuriously

## 2015-11-18 LAB — HEPATITIS C ANTIBODY

## 2015-11-18 LAB — ANA W/REFLEX IF POSITIVE: Anti Nuclear Antibody(ANA): NEGATIVE

## 2015-11-18 LAB — HIV ANTIBODY (ROUTINE TESTING W REFLEX): HIV SCREEN 4TH GENERATION: NONREACTIVE

## 2015-11-24 ENCOUNTER — Ambulatory Visit: Payer: 59 | Admitting: Hematology and Oncology

## 2016-10-23 ENCOUNTER — Encounter: Payer: 59 | Admitting: Internal Medicine

## 2016-11-06 ENCOUNTER — Ambulatory Visit (INDEPENDENT_AMBULATORY_CARE_PROVIDER_SITE_OTHER): Payer: 59 | Admitting: Family Medicine

## 2016-11-06 ENCOUNTER — Other Ambulatory Visit (HOSPITAL_COMMUNITY)
Admission: RE | Admit: 2016-11-06 | Discharge: 2016-11-06 | Disposition: A | Discharge status: Home / Self Care | Payer: 59 | Source: Ambulatory Visit | Attending: Family Medicine | Admitting: Family Medicine

## 2016-11-06 ENCOUNTER — Encounter: Payer: 59 | Admitting: Internal Medicine

## 2016-11-06 ENCOUNTER — Encounter: Payer: Self-pay | Admitting: Family Medicine

## 2016-11-06 VITALS — BP 142/90 | HR 68 | Temp 98.1°F

## 2016-11-06 DIAGNOSIS — Z130 Encounter for screening for diseases of the blood and blood-forming organs and certain disorders involving the immune mechanism: Secondary | ICD-10-CM

## 2016-11-06 DIAGNOSIS — Z1389 Encounter for screening for other disorder: Secondary | ICD-10-CM

## 2016-11-06 DIAGNOSIS — Z1329 Encounter for screening for other suspected endocrine disorder: Secondary | ICD-10-CM | POA: Diagnosis not present

## 2016-11-06 DIAGNOSIS — Z13220 Encounter for screening for lipoid disorders: Secondary | ICD-10-CM | POA: Diagnosis not present

## 2016-11-06 DIAGNOSIS — Z124 Encounter for screening for malignant neoplasm of cervix: Secondary | ICD-10-CM

## 2016-11-06 DIAGNOSIS — R87810 Cervical high risk human papillomavirus (HPV) DNA test positive: Secondary | ICD-10-CM | POA: Insufficient documentation

## 2016-11-06 DIAGNOSIS — Z23 Encounter for immunization: Secondary | ICD-10-CM | POA: Diagnosis not present

## 2016-11-06 DIAGNOSIS — N951 Menopausal and female climacteric states: Secondary | ICD-10-CM

## 2016-11-06 DIAGNOSIS — Z0000 Encounter for general adult medical examination without abnormal findings: Secondary | ICD-10-CM

## 2016-11-06 DIAGNOSIS — Z0001 Encounter for general adult medical examination with abnormal findings: Secondary | ICD-10-CM

## 2016-11-06 LAB — CBC
HCT: 41.2 % (ref 36.0–46.0)
Hemoglobin: 13.8 g/dL (ref 12.0–15.0)
MCHC: 33.5 g/dL (ref 30.0–36.0)
MCV: 97.4 fl (ref 78.0–100.0)
PLATELETS: 188 10*3/uL (ref 150.0–400.0)
RBC: 4.23 Mil/uL (ref 3.87–5.11)
RDW: 11.8 % (ref 11.5–15.5)
WBC: 5.1 10*3/uL (ref 4.0–10.5)

## 2016-11-06 LAB — COMPREHENSIVE METABOLIC PANEL
ALBUMIN: 4.9 g/dL (ref 3.5–5.2)
ALT: 12 U/L (ref 0–35)
AST: 18 U/L (ref 0–37)
Alkaline Phosphatase: 54 U/L (ref 39–117)
BILIRUBIN TOTAL: 0.6 mg/dL (ref 0.2–1.2)
BUN: 16 mg/dL (ref 6–23)
CALCIUM: 9.6 mg/dL (ref 8.4–10.5)
CO2: 30 mEq/L (ref 19–32)
CREATININE: 0.73 mg/dL (ref 0.40–1.20)
Chloride: 103 mEq/L (ref 96–112)
GFR: 90.11 mL/min (ref >60.00)
Glucose, Bld: 108 mg/dL — ABNORMAL HIGH (ref 70–99)
Potassium: 4.1 mEq/L (ref 3.5–5.1)
Sodium: 142 mEq/L (ref 135–145)
Total Protein: 7.5 g/dL (ref 6.0–8.3)

## 2016-11-06 LAB — LIPID PANEL
CHOL/HDL RATIO: 2
CHOLESTEROL: 193 mg/dL (ref 0–200)
HDL: 82.2 mg/dL (ref >39.00)
LDL Cholesterol: 100 mg/dL — ABNORMAL HIGH (ref 0–99)
NonHDL: 111.05
TRIGLYCERIDES: 57 mg/dL (ref 0.0–149.0)
VLDL: 11.4 mg/dL (ref 0.0–40.0)

## 2016-11-06 LAB — TSH: TSH: 1.45 u[IU]/mL (ref 0.35–4.50)

## 2016-11-06 MED ORDER — PAROXETINE HCL 10 MG PO TABS: 10.0000 mg | ORAL_TABLET | Freq: Every day | ORAL | 1 refills | Status: DC

## 2016-11-06 NOTE — Progress Notes (Signed)
Subjective:    Patient ID: Jill SicksDarlene Gay, female    DOB: 1965/06/11, 51 y.o.   MRN: [id number]  HPI This is a 51 yo female who presents today for CPE. Has had a pretty good year health wise.   Last period greater than one year ago. Has been having hot flashes. Feels "crazy." Very sensitive several weeks out of the month, poor sleep. Marriage "not healthy." Had bad PMS when she was younger, with similar symptoms. Takes evening primrose and black cohash but hot flashes seem to be getting worse. Not sleeping well which makes mood worse. Hot flashes are all during the day. Some vaginal dryness.  Last CPE- 10/17 Mammo- 05/24/2015 Pap- 2015 Colonoscopy- NA Tdap- 11/16/2010 Flu- annual Eye- annual, due Dental- bi annual Exercise- walks, stretches    Past Medical History:  Diagnosis Date  . CHICKENPOX, HX OF   . Gestational hypertension   . PUD (peptic ulcer disease)   . VENEREAL WART    Past Surgical History:  Procedure Laterality Date  . BACK SURGERY  02/2015  . PELVIC TUMOR REMOVED  2006    Family History  Problem Relation Age of Onset  . Alcohol abuse Mother   . Arthritis Mother   . Depression Mother   . Melanoma Sister   . Alcohol abuse Maternal Grandmother   . Arthritis Maternal Grandmother   . Heart disease Maternal Grandmother   . Stroke Maternal Grandmother    Social History  Substance Use Topics  . Smoking status: Former Games developermoker  . Smokeless tobacco: Never Used     Comment: quit in 2001  . Alcohol use 0.0 oz/week     Comment: social     Review of Systems  Constitutional: Negative for fatigue, fever and unexpected weight change.  HENT: Negative.   Eyes: Negative.   Respiratory: Negative for cough and shortness of breath.   Cardiovascular: Negative for chest pain, palpitations and leg swelling.  Gastrointestinal: Negative for abdominal pain, constipation, diarrhea, nausea and vomiting.  Endocrine: Negative for cold intolerance and heat intolerance.    Genitourinary: Negative for dyspareunia, dysuria, frequency, hematuria and vaginal bleeding.  Musculoskeletal: Negative.   Skin: Negative.   Neurological: Negative for dizziness, light-headedness and headaches.  Psychiatric/Behavioral: Positive for dysphoric mood and sleep disturbance. The patient is nervous/anxious.        Objective:   Physical Exam Physical Exam  Constitutional: She is oriented to person, place, and time. She appears well-developed and well-nourished. No distress.  HENT:  Head: Normocephalic and atraumatic.  Right Ear: External ear normal.  Left Ear: External ear normal.  Nose: Nose normal.  Mouth/Throat: Oropharynx is clear and moist. No oropharyngeal exudate.  Eyes: Conjunctivae are normal. Pupils are equal, round, and reactive to light.  Neck: Normal range of motion. Neck supple. No JVD present. No thyromegaly present.  Cardiovascular: Normal rate, regular rhythm, normal heart sounds and intact distal pulses.   Pulmonary/Chest: Effort normal and breath sounds normal. Right breast exhibits no inverted nipple, no mass, no nipple discharge, no skin change and no tenderness. Left breast exhibits no inverted nipple, no mass, no nipple discharge, no skin change and no tenderness. Breasts are symmetrical.  Abdominal: Soft. Bowel sounds are normal. She exhibits no distension and no mass. There is no tenderness. There is no rebound and no guarding.  Genitourinary: Vagina normal. Pelvic exam was performed with patient supine. There is no rash, tenderness, lesion or injury on the right labia. There is no rash, tenderness, lesion or injury  on the left labia. Cervix exhibits no motion tenderness and no discharge. No vaginal discharge found.  Musculoskeletal: Normal range of motion. She exhibits no edema or tenderness.  Lymphadenopathy:    She has no cervical adenopathy.  Neurological: She is alert and oriented to person, place, and time. She has normal reflexes.  Skin: Skin is  warm and dry. She is not diaphoretic.  Psychiatric: She has a normal mood and affect. Her behavior is normal. She is occasionally tearful Judgment and thought content normal.  Vitals reviewed.     BP (!) 142/90 (BP Location: Right Arm, Patient Position: Sitting, Cuff Size: Normal)   Pulse 68   Temp 98.1 F (36.7 C) (Oral)   SpO2 99%  Wt Readings from Last 3 Encounters:  11/17/15 134 lb 7.7 oz (61 kg)  10/21/15 128 lb 8 oz (58.3 kg)  08/11/15 132 lb 1.9 oz (59.9 kg)       Assessment & Plan:  1. Annual physical exam -Discussed and encouraged healthy lifestyle choices- adequate sleep, regular exercise, stress management and healthy food choices.    2. Menopausal symptoms - Discussed expectations of treatment, encouraged regular exercise, good sleep hygiene - PARoxetine (PAXIL) 10 MG tablet; Take 1 tablet (10 mg total) by mouth at bedtime.  Dispense: 90 tablet; Refill: 1 - follow up in 6-8 weeks, sooner if symptoms worsen  3. Screening for deficiency anemia - CBC  4. Screening for thyroid disorder - TSH  5. Screening for nephropathy - Comprehensive metabolic panel  6. Screening for lipid disorders - Lipid panel  7. Screening for cervical cancer - Cytology - PAP  8. Need for influenza vaccination - Flu Vaccine QUAD 6+ mos PF IM (Fluarix Quad PF)   Olean Ree, FNP-BC  [facility], [facility]  11/11/2016 12:15 PM

## 2016-11-06 NOTE — Patient Instructions (Signed)
I have sent a prescription to your pharmacy You can also add Vitamin E 400 IU daily Please follow up in 6-8 weeks  Menopause Menopause is the normal time of life when menstrual periods stop completely. Menopause is complete when you have missed 12 consecutive menstrual periods. It usually occurs between the ages of 48 years and 55 years. Very rarely does a woman develop menopause before the age of 40 years. At menopause, your ovaries stop producing the female hormones estrogen and progesterone. This can cause undesirable symptoms and also affect your health. Sometimes the symptoms may occur 4-5 years before the menopause begins. There is no relationship between menopause and:  Oral contraceptives.  Number of children you had.  Race.  The age your menstrual periods started (menarche).  Heavy smokers and very thin women may develop menopause earlier in life. What are the causes?  The ovaries stop producing the female hormones estrogen and progesterone. Other causes include:  Surgery to remove both ovaries.  The ovaries stop functioning for no known reason.  Tumors of the pituitary gland in the brain.  Medical disease that affects the ovaries and hormone production.  Radiation treatment to the abdomen or pelvis.  Chemotherapy that affects the ovaries.  What are the signs or symptoms?  Hot flashes.  Night sweats.  Decrease in sex drive.  Vaginal dryness and thinning of the vagina causing painful intercourse.  Dryness of the skin and developing wrinkles.  Headaches.  Tiredness.  Irritability.  Memory problems.  Weight gain.  Bladder infections.  Hair growth of the face and chest.  Infertility. More serious symptoms include:  Loss of bone (osteoporosis) causing breaks (fractures).  Depression.  Hardening and narrowing of the arteries (atherosclerosis) causing heart attacks and strokes.  How is this diagnosed?  When the menstrual periods have stopped for  12 straight months.  Physical exam.  Hormone studies of the blood. How is this treated? There are many treatment choices and nearly as many questions about them. The decisions to treat or not to treat menopausal changes is an individual choice made with your health care provider. Your health care provider can discuss the treatments with you. Together, you can decide which treatment will work best for you. Your treatment choices may include:  Hormone therapy (estrogen and progesterone).  Non-hormonal medicines.  Treating the individual symptoms with medicine (for example antidepressants for depression).  Herbal medicines that may help specific symptoms.  Counseling by a psychiatrist or psychologist.  Group therapy.  Lifestyle changes including: ? Eating healthy. ? Regular exercise. ? Limiting caffeine and alcohol. ? Stress management and meditation.  No treatment.  Follow these instructions at home:  Take the medicine your health care provider gives you as directed.  Get plenty of sleep and rest.  Exercise regularly.  Eat a diet that contains calcium (good for the bones) and soy products (acts like estrogen hormone).  Avoid alcoholic beverages.  Do not smoke.  If you have hot flashes, dress in layers.  Take supplements, calcium, and vitamin D to strengthen bones.  You can use over-the-counter lubricants or moisturizers for vaginal dryness.  Group therapy is sometimes very helpful.  Acupuncture may be helpful in some cases. Contact a health care provider if:  You are not sure you are in menopause.  You are having menopausal symptoms and need advice and treatment.  You are still having menstrual periods after age 26 years.  You have pain with intercourse.  Menopause is complete (no menstrual period for  12 months) and you develop vaginal bleeding.  You need a referral to a specialist (gynecologist, psychiatrist, or psychologist) for treatment. Get help  right away if:  You have severe depression.  You have excessive vaginal bleeding.  You fell and think you have a broken bone.  You have pain when you urinate.  You develop leg or chest pain.  You have a fast pounding heart beat (palpitations).  You have severe headaches.  You develop vision problems.  You feel a lump in your breast.  You have abdominal pain or severe indigestion. This information is not intended to replace advice given to you by your health care provider. Make sure you discuss any questions you have with your health care provider. Document Released: 03/18/2003 Document Revised: 06/03/2015 Document Reviewed: 07/25/2012 Elsevier Interactive Patient Education  2017 [employer]. Keeping You Healthy  Get These Tests 1. Blood Pressure- Have your blood pressure checked once a year by your health care provider.  Normal blood pressure is 120/80. 2. Weight- Have your body mass index (BMI) calculated to screen for obesity.  BMI is measure of body fat based on height and weight.  You can also calculate your own BMI at https://www.west-esparza.com/. 3. Cholesterol- Have your cholesterol checked every 5 years starting at age 54 then yearly starting at age 73. 4. Chlamydia, HIV, and other sexually transmitted diseases- Get screened every year until age 64, then within three months of each new sexual provider. 5. Pap Test - Every 1-5 years; discuss with your health care provider. 6. Mammogram- Every 1-2 years starting at age 28--50  Take these medicines  Calcium with Vitamin D-Your body needs 1200 mg of Calcium each day and [phone] IU of Vitamin D daily.  Your body can only absorb 500 mg of Calcium at a time so Calcium must be taken in 2 or 3 divided doses throughout the day.  Multivitamin with folic acid- Once daily if it is possible for you to become pregnant.  Get these Immunizations  Gardasil-Series of three doses; prevents HPV related illness such as genital warts and  cervical cancer.  Menactra-Single dose; prevents meningitis.  Tetanus shot- Every 10 years.  Flu shot-Every year.  Take these steps 1. Do not smoke-Your healthcare provider can help you quit.  For tips on how to quit go to www.smokefree.gov or call 1-800 QUITNOW. 2. Be physically active- Exercise 5 days a week for at least 30 minutes.  If you are not already physically active, start slow and gradually work up to 30 minutes of moderate physical activity.  Examples of moderate activity include walking briskly, dancing, swimming, bicycling, etc. 3. Breast Cancer- A self breast exam every month is important for early detection of breast cancer.  For more information and instruction on self breast exams, ask your healthcare provider or [place]. 4. Eat a healthy diet- Eat a variety of healthy foods such as fruits, vegetables, whole grains, low fat milk, low fat cheeses, yogurt, lean meats, poultry and fish, beans, nuts, tofu, etc.  For more information go to www. Thenutritionsource.org 5. Drink alcohol in moderation- Limit alcohol intake to one drink or less per day. Never drink and drive. 6. Depression- Your emotional health is as important as your physical health.  If you're feeling down or losing interest in things you normally enjoy please talk to your healthcare provider about being screened for depression. 7. Dental visit- Brush and floss your teeth twice daily; visit your dentist twice a year. 8. Eye doctor- Get an  eye exam at least every 2 years. 9. Helmet use- Always wear a helmet when riding a bicycle, motorcycle, rollerblading or skateboarding. 10. Safe sex- If you may be exposed to sexually transmitted infections, use a condom. 11. Seat belts- Seat belts can save your live; always wear one. 12. Smoke/Carbon Monoxide detectors- These detectors need to be installed on the appropriate level of your home. Replace batteries at least once a year. 13. Skin  cancer- When out in the sun please cover up and use sunscreen 15 SPF or higher. 14. Violence- If anyone is threatening or hurting you, please tell your healthcare provider.

## 2016-11-08 LAB — CYTOLOGY - PAP
DIAGNOSIS: NEGATIVE
HPV (WINDOPATH): DETECTED — AB
HPV 16/18/45 genotyping: NEGATIVE

## 2016-12-28 ENCOUNTER — Encounter: Payer: Self-pay | Admitting: Internal Medicine

## 2016-12-28 ENCOUNTER — Ambulatory Visit: Payer: 59 | Admitting: Internal Medicine

## 2016-12-28 VITALS — BP 126/80 | HR 66 | Temp 97.9°F | Wt 136.0 lb

## 2016-12-28 DIAGNOSIS — N951 Menopausal and female climacteric states: Secondary | ICD-10-CM | POA: Diagnosis not present

## 2016-12-28 NOTE — Progress Notes (Signed)
Subjective:    Patient ID: Jill Gay, female    DOB: [birth year], 51 y.o.   MRN: [id number]  HPI  Pt presents to the clinic today for follow up. She saw Jill Gay 10/2016 for her annual exam. She was started on Paxil for menopausal symptoms, specifically hot flashes, insomnia, mood swings, and vaginal dryness. Since starting the Paxil, she reports great improvement in her symptoms. She does not feel "crazy", her mood swings, insomnia and hot flashes have all improved. She denies any adverse side effects. She does not think she needs any medication adjustment at this time.  Review of Systems      Past Medical History:  Diagnosis Date  . CHICKENPOX, HX OF   . Gestational hypertension   . PUD (peptic ulcer disease)   . VENEREAL WART     Current Outpatient Medications  Medication Sig Dispense Refill  . Biotin 5000 MCG CAPS Take 1 capsule by mouth 2 (two) times daily.    . Black Cohosh 40 MG CAPS Take 1 capsule by mouth daily.    . Cyanocobalamin (VITAMIN B-12) 2500 MCG SUBL Place 1 tablet under the tongue daily.    . Evening Primrose Oil 1000 MG CAPS Take 2-3 capsules by mouth daily.    Marland Kitchen. PARoxetine (PAXIL) 10 MG tablet Take 1 tablet (10 mg total) by mouth at bedtime. 90 tablet 1   No current facility-administered medications for this visit.     Allergies  Allergen Reactions  . Bactrim [Sulfamethoxazole-Trimethoprim] Swelling    Blisters, erythema to skin  . Doxycycline Hives    Joint pains    Family History  Problem Relation Age of Onset  . Alcohol abuse Mother   . Arthritis Mother   . Depression Mother   . Melanoma Sister   . Alcohol abuse Maternal Grandmother   . Arthritis Maternal Grandmother   . Heart disease Maternal Grandmother   . Stroke Maternal Grandmother     Social History   Socioeconomic History  . Marital status: Married    Spouse name: Not on file  . Number of children: 2  . Years of education: Not on file  . Highest education level: Not  on file  Social Needs  . Financial resource strain: Not on file  . Food insecurity - worry: Not on file  . Food insecurity - inability: Not on file  . Transportation needs - medical: Not on file  . Transportation needs - non-medical: Not on file  Occupational History  . Not on file  Tobacco Use  . Smoking status: Former Games developermoker  . Smokeless tobacco: Never Used  . Tobacco comment: quit in 2001  Substance and Sexual Activity  . Alcohol use: Yes    Alcohol/week: 0.0 oz    Comment: social  . Drug use: No  . Sexual activity: Yes  Other Topics Concern  . Not on file  Social History Narrative  . Not on file     Constitutional: Denies fever, malaise, fatigue, headache or abrupt weight changes.  GU: Pt reports vaginal dryness. Denies urgency, frequency, pain with urination, burning sensation, blood in urine, odor or discharge. Neurological: Pt reports insomnia. Denies dizziness, difficulty with memory, difficulty with speech or problems with balance and coordination.  Psych: Pt reports mood swings. Denies anxiety, depression, SI/HI.  No other specific complaints in a complete review of systems (except as listed in HPI above).  Objective:   Physical Exam   BP 126/80   Pulse 66   Temp  97.9 F (36.6 C) (Oral)   Wt 136 lb (61.7 kg)   SpO2 99%   BMI 23.34 kg/m  Wt Readings from Last 3 Encounters:  12/28/16 136 lb (61.7 kg)  11/17/15 134 lb 7.7 oz (61 kg)  10/21/15 128 lb 8 oz (58.3 kg)    General: Appears her stated age, well developed, well nourished in NAD. Cardiovascular: Normal rate and rhythm. S1,S2 noted.  No murmur, rubs or gallops noted.  Pulmonary/Chest: Normal effort and positive vesicular breath sounds. No respiratory distress. No wheezes, rales or ronchi noted.  Neurological: Alert and oriented.  Psychiatric: Mood and affect normal. Behavior is normal. Judgment and thought content normal.    BMET    Component Value Date/Time   NA 142 11/06/2016 1114   K 4.1  11/06/2016 1114   CL 103 11/06/2016 1114   CO2 30 11/06/2016 1114   GLUCOSE 108 (H) 11/06/2016 1114   BUN 16 11/06/2016 1114   CREATININE 0.73 11/06/2016 1114   CALCIUM 9.6 11/06/2016 1114   GFRNONAA 84.20 07/15/2008 0846    Lipid Panel     Component Value Date/Time   CHOL 193 11/06/2016 1114   TRIG 57.0 11/06/2016 1114   HDL 82.20 11/06/2016 1114   CHOLHDL 2 11/06/2016 1114   VLDL 11.4 11/06/2016 1114   LDLCALC 100 (H) 11/06/2016 1114    CBC    Component Value Date/Time   WBC 5.1 11/06/2016 1114   RBC 4.23 11/06/2016 1114   HGB 13.8 11/06/2016 1114   HCT 41.2 11/06/2016 1114   PLT 188.0 11/06/2016 1114   MCV 97.4 11/06/2016 1114   MCH 32.4 11/17/2015 1455   MCHC 33.5 11/06/2016 1114   RDW 11.8 11/06/2016 1114   LYMPHSABS 1.2 11/17/2015 1455   MONOABS 0.2 11/17/2015 1455   EOSABS 0.0 11/17/2015 1455   BASOSABS 0.0 11/17/2015 1455    Hgb A1C No results found for: HGBA1C         Assessment & Plan:   Menopausal Symptoms:  Improved with Paxil No dose adjustments needed at this time She does not need refill at this time  Will monitor, RTC in 10 months for your annual exam Nicki ReaperBAITY, Jorge Amparo, NP

## 2016-12-28 NOTE — Patient Instructions (Signed)
Menopause Menopause is the normal time of life when menstrual periods stop completely. Menopause is complete when you have missed 12 consecutive menstrual periods. It usually occurs between the ages of 48 years and 55 years. Very rarely does a woman develop menopause before the age of 40 years. At menopause, your ovaries stop producing the female hormones estrogen and progesterone. This can cause undesirable symptoms and also affect your health. Sometimes the symptoms may occur 4-5 years before the menopause begins. There is no relationship between menopause and:  Oral contraceptives.  Number of children you had.  Race.  The age your menstrual periods started (menarche).  Heavy smokers and very thin women may develop menopause earlier in life. What are the causes?  The ovaries stop producing the female hormones estrogen and progesterone. Other causes include:  Surgery to remove both ovaries.  The ovaries stop functioning for no known reason.  Tumors of the pituitary gland in the brain.  Medical disease that affects the ovaries and hormone production.  Radiation treatment to the abdomen or pelvis.  Chemotherapy that affects the ovaries.  What are the signs or symptoms?  Hot flashes.  Night sweats.  Decrease in sex drive.  Vaginal dryness and thinning of the vagina causing painful intercourse.  Dryness of the skin and developing wrinkles.  Headaches.  Tiredness.  Irritability.  Memory problems.  Weight gain.  Bladder infections.  Hair growth of the face and chest.  Infertility. More serious symptoms include:  Loss of bone (osteoporosis) causing breaks (fractures).  Depression.  Hardening and narrowing of the arteries (atherosclerosis) causing heart attacks and strokes.  How is this diagnosed?  When the menstrual periods have stopped for 12 straight months.  Physical exam.  Hormone studies of the blood. How is this treated? There are many treatment  choices and nearly as many questions about them. The decisions to treat or not to treat menopausal changes is an individual choice made with your health care provider. Your health care provider can discuss the treatments with you. Together, you can decide which treatment will work best for you. Your treatment choices may include:  Hormone therapy (estrogen and progesterone).  Non-hormonal medicines.  Treating the individual symptoms with medicine (for example antidepressants for depression).  Herbal medicines that may help specific symptoms.  Counseling by a psychiatrist or psychologist.  Group therapy.  Lifestyle changes including: ? Eating healthy. ? Regular exercise. ? Limiting caffeine and alcohol. ? Stress management and meditation.  No treatment.  Follow these instructions at home:  Take the medicine your health care provider gives you as directed.  Get plenty of sleep and rest.  Exercise regularly.  Eat a diet that contains calcium (good for the bones) and soy products (acts like estrogen hormone).  Avoid alcoholic beverages.  Do not smoke.  If you have hot flashes, dress in layers.  Take supplements, calcium, and vitamin D to strengthen bones.  You can use over-the-counter lubricants or moisturizers for vaginal dryness.  Group therapy is sometimes very helpful.  Acupuncture may be helpful in some cases. Contact a health care provider if:  You are not sure you are in menopause.  You are having menopausal symptoms and need advice and treatment.  You are still having menstrual periods after age 55 years.  You have pain with intercourse.  Menopause is complete (no menstrual period for 12 months) and you develop vaginal bleeding.  You need a referral to a specialist (gynecologist, psychiatrist, or psychologist) for treatment. Get help right   away if:  You have severe depression.  You have excessive vaginal bleeding.  You fell and think you have a  broken bone.  You have pain when you urinate.  You develop leg or chest pain.  You have a fast pounding heart beat (palpitations).  You have severe headaches.  You develop vision problems.  You feel a lump in your breast.  You have abdominal pain or severe indigestion. This information is not intended to replace advice given to you by your health care provider. Make sure you discuss any questions you have with your health care provider. Document Released: 03/18/2003 Document Revised: 06/03/2015 Document Reviewed: 07/25/2012 Elsevier Interactive Patient Education  2017 [employer]  

## 2017-05-01 ENCOUNTER — Other Ambulatory Visit: Payer: Self-pay | Admitting: Family Medicine

## 2017-05-01 DIAGNOSIS — N951 Menopausal and female climacteric states: Secondary | ICD-10-CM

## 2017-10-27 ENCOUNTER — Other Ambulatory Visit: Payer: Self-pay | Admitting: Internal Medicine

## 2017-10-27 DIAGNOSIS — N951 Menopausal and female climacteric states: Secondary | ICD-10-CM

## 2017-11-08 ENCOUNTER — Ambulatory Visit (INDEPENDENT_AMBULATORY_CARE_PROVIDER_SITE_OTHER): Payer: 59 | Admitting: Internal Medicine

## 2017-11-08 ENCOUNTER — Encounter: Payer: Self-pay | Admitting: Internal Medicine

## 2017-11-08 VITALS — BP 122/84 | HR 72 | Temp 98.5°F | Ht 64.0 in | Wt 137.0 lb

## 2017-11-08 DIAGNOSIS — Z0000 Encounter for general adult medical examination without abnormal findings: Secondary | ICD-10-CM | POA: Diagnosis not present

## 2017-11-08 DIAGNOSIS — N951 Menopausal and female climacteric states: Secondary | ICD-10-CM | POA: Diagnosis not present

## 2017-11-08 DIAGNOSIS — Z1239 Encounter for other screening for malignant neoplasm of breast: Secondary | ICD-10-CM

## 2017-11-08 DIAGNOSIS — E875 Hyperkalemia: Secondary | ICD-10-CM | POA: Diagnosis not present

## 2017-11-08 DIAGNOSIS — Z23 Encounter for immunization: Secondary | ICD-10-CM | POA: Diagnosis not present

## 2017-11-08 LAB — COMPREHENSIVE METABOLIC PANEL
ALT: 14 U/L (ref 0–35)
AST: 19 U/L (ref 0–37)
Albumin: 4.8 g/dL (ref 3.5–5.2)
Alkaline Phosphatase: 57 U/L (ref 39–117)
BUN: 20 mg/dL (ref 6–23)
CHLORIDE: 103 meq/L (ref 96–112)
CO2: 32 mEq/L (ref 19–32)
Calcium: 9.5 mg/dL (ref 8.4–10.5)
Creatinine, Ser: 0.79 mg/dL (ref 0.40–1.20)
GFR: 81.92 mL/min (ref >60.00)
GLUCOSE: 105 mg/dL — AB (ref 70–99)
POTASSIUM: 5.7 meq/L — AB (ref 3.5–5.1)
SODIUM: 140 meq/L (ref 135–145)
Total Bilirubin: 0.5 mg/dL (ref 0.2–1.2)
Total Protein: 7.4 g/dL (ref 6.0–8.3)

## 2017-11-08 LAB — CBC
HEMATOCRIT: 41 % (ref 36.0–46.0)
Hemoglobin: 14.1 g/dL (ref 12.0–15.0)
MCHC: 34.3 g/dL (ref 30.0–36.0)
MCV: 94.6 fl (ref 78.0–100.0)
Platelets: 182 10*3/uL (ref 150.0–400.0)
RBC: 4.34 Mil/uL (ref 3.87–5.11)
RDW: 12.7 % (ref 11.5–15.5)
WBC: 3.2 10*3/uL — ABNORMAL LOW (ref 4.0–10.5)

## 2017-11-08 LAB — LIPID PANEL
CHOL/HDL RATIO: 2
Cholesterol: 185 mg/dL (ref 0–200)
HDL: 80.7 mg/dL (ref >39.00)
LDL CALC: 97 mg/dL (ref 0–99)
NonHDL: 104.48
Triglycerides: 37 mg/dL (ref 0.0–149.0)
VLDL: 7.4 mg/dL (ref 0.0–40.0)

## 2017-11-08 LAB — VITAMIN D 25 HYDROXY (VIT D DEFICIENCY, FRACTURES): VITD: 34.16 ng/mL (ref 30.00–100.00)

## 2017-11-08 MED ORDER — PAROXETINE HCL 10 MG PO TABS: ORAL_TABLET | ORAL | 3 refills | Status: DC

## 2017-11-08 NOTE — Progress Notes (Signed)
Subjective:    Patient ID: Jill Gay, female    DOB: 29-Sep-1965, 52 y.o.   MRN: [id number]  HPI  Pt presents to the clinic today for her annual exam.  Menopausal Symptoms: Controlled on Paxil and Black Cohosh.  Flu: 10/2016 Tetanus: 2012 Pap Smear: 10/2016 Mammogram: 05/2015 Colon Screening: never Vision Screening: annually Dentist: annually  Diet: She does eat meat. She consumes fruits and veggies daily. She does eat fried foods. She drinks mostly water, apple cider vinegar and beer. Exercise: None  Review of Systems      Past Medical History:  Diagnosis Date  . CHICKENPOX, HX OF   . Gestational hypertension   . PUD (peptic ulcer disease)   . VENEREAL WART     Current Outpatient Medications  Medication Sig Dispense Refill  . Ascorbic Acid (VITAMIN C) 1000 MG tablet     . Black Cohosh 540 MG CAPS     . Evening Primrose Oil 1000 MG CAPS Take 2-3 capsules by mouth daily.    Marland Kitchen PARoxetine (PAXIL) 10 MG tablet TAKE 1 TABLET BY MOUTH EVERYDAY AT BEDTIME 90 tablet 0  . Turmeric 500 MG CAPS     . vitamin E 200 UNIT capsule      No current facility-administered medications for this visit.     Allergies  Allergen Reactions  . Bactrim [Sulfamethoxazole-Trimethoprim] Swelling    Blisters, erythema to skin  . Doxycycline Hives    Joint pains    Family History  Problem Relation Age of Onset  . Alcohol abuse Mother   . Arthritis Mother   . Depression Mother   . Melanoma Sister   . Alcohol abuse Maternal Grandmother   . Arthritis Maternal Grandmother   . Heart disease Maternal Grandmother   . Stroke Maternal Grandmother     Social History   Socioeconomic History  . Marital status: Married    Spouse name: Not on file  . Number of children: 2  . Years of education: Not on file  . Highest education level: Not on file  Occupational History  . Not on file  Social Needs  . Financial resource strain: Not on file  . Food insecurity:    Worry: Not on file    Inability: Not on file  . Transportation needs:    Medical: Not on file    Non-medical: Not on file  Tobacco Use  . Smoking status: Former Games developer  . Smokeless tobacco: Never Used  . Tobacco comment: quit in 2001  Substance and Sexual Activity  . Alcohol use: Yes    Alcohol/week: 0.0 standard drinks    Comment: social  . Drug use: No  . Sexual activity: Yes  Lifestyle  . Physical activity:    Days per week: Not on file    Minutes per session: Not on file  . Stress: Not on file  Relationships  . Social connections:    Talks on phone: Not on file    Gets together: Not on file    Attends religious service: Not on file    Active member of club or organization: Not on file    Attends meetings of clubs or organizations: Not on file    Relationship status: Not on file  . Intimate partner violence:    Fear of current or ex partner: Not on file    Emotionally abused: Not on file    Physically abused: Not on file    Forced sexual activity: Not on file  Other  Topics Concern  . Not on file  Social History Narrative  . Not on file     Constitutional: Denies fever, malaise, fatigue, headache or abrupt weight changes.  HEENT: Denies eye pain, eye redness, ear pain, ringing in the ears, wax buildup, runny nose, nasal congestion, bloody nose, or sore throat. Respiratory: Denies difficulty breathing, shortness of breath, cough or sputum production.   Cardiovascular: Denies chest pain, chest tightness, palpitations or swelling in the hands or feet.  Gastrointestinal: Denies abdominal pain, bloating, constipation, diarrhea or blood in the stool.  GU: Denies urgency, frequency, pain with urination, burning sensation, blood in urine, odor or discharge. Musculoskeletal: Denies decrease in range of motion, difficulty with gait, muscle pain or joint pain and swelling.  Skin: Denies redness, rashes, lesions or ulcercations.  Neurological: Denies dizziness, difficulty with memory, difficulty with  speech or problems with balance and coordination.  Psych: Denies anxiety, depression, SI/HI.  No other specific complaints in a complete review of systems (except as listed in HPI above).  Objective:   Physical Exam  BP 122/84   Pulse 72   Temp 98.5 F (36.9 C) (Oral)   Ht 5\' 4"  (1.626 m)   Wt 137 lb (62.1 kg)   SpO2 98%   BMI 23.52 kg/m  Wt Readings from Last 3 Encounters:  11/08/17 137 lb (62.1 kg)  12/28/16 136 lb (61.7 kg)  11/17/15 134 lb 7.7 oz (61 kg)    General: Appears her stated age, well developed, well nourished in NAD. Skin: Warm, dry and intact.  HEENT: Head: normal shape and size; Eyes: sclera white, no icterus, conjunctiva pink, PERRLA and EOMs intact; Ears: Tm's gray and intact, normal light reflex; Throat/Mouth: Teeth present, mucosa pink and moist, no exudate, lesions or ulcerations noted.  Neck:  Neck supple, trachea midline. No masses, lumps or thyromegaly present.  Cardiovascular: Normal rate and rhythm. S1,S2 noted.  No murmur, rubs or gallops noted. No JVD or BLE edema. No carotid bruits noted. Pulmonary/Chest: Normal effort and positive vesicular breath sounds. No respiratory distress. No wheezes, rales or ronchi noted.  Abdomen: Soft and nontender. Normal bowel sounds. No distention or masses noted. Liver, spleen and kidneys non palpable. Musculoskeletal: Strength 5/5 BUE/BLE. No difficulty with gait.  Neurological: Alert and oriented. Cranial nerves II-XII grossly intact. Coordination normal.  Psychiatric: Mood and affect normal. Behavior is normal. Judgment and thought content normal.     BMET    Component Value Date/Time   NA 142 11/06/2016 1114   K 4.1 11/06/2016 1114   CL 103 11/06/2016 1114   CO2 30 11/06/2016 1114   GLUCOSE 108 (H) 11/06/2016 1114   BUN 16 11/06/2016 1114   CREATININE 0.73 11/06/2016 1114   CALCIUM 9.6 11/06/2016 1114   GFRNONAA 84.20 07/15/2008 0846    Lipid Panel     Component Value Date/Time   CHOL 193  11/06/2016 1114   TRIG 57.0 11/06/2016 1114   HDL 82.20 11/06/2016 1114   CHOLHDL 2 11/06/2016 1114   VLDL 11.4 11/06/2016 1114   LDLCALC 100 (H) 11/06/2016 1114    CBC    Component Value Date/Time   WBC 5.1 11/06/2016 1114   RBC 4.23 11/06/2016 1114   HGB 13.8 11/06/2016 1114   HCT 41.2 11/06/2016 1114   PLT 188.0 11/06/2016 1114   MCV 97.4 11/06/2016 1114   MCH 32.4 11/17/2015 1455   MCHC 33.5 11/06/2016 1114   RDW 11.8 11/06/2016 1114   LYMPHSABS 1.2 11/17/2015 1455   MONOABS  0.2 11/17/2015 1455   EOSABS 0.0 11/17/2015 1455   BASOSABS 0.0 11/17/2015 1455    Hgb A1C No results found for: HGBA1C          Assessment & Plan:    Preventative Health Maintenance:  Flu shot today Tetanus UTD Pap smear UTD Mammogram ordered, she will call [facility] to schedule (number provided) She declines colonoscopy or Cologuard at this time Encouraged her to consume a balanced diet and exercise regimen Advised her to see an eye doctor and dentist annually Will check CBC, CMET, Lipid and Vit D today  RTC in 1 year, sooner if needed Nicki Reaper, NP

## 2017-11-08 NOTE — Assessment & Plan Note (Signed)
Stable on Paxil and Black Cohosh Paxil refilled today

## 2017-11-08 NOTE — Patient Instructions (Signed)
Health Maintenance for Postmenopausal Women Menopause is a normal process in which your reproductive ability comes to an end. This process happens gradually over a span of months to years, usually between the ages of 22 and 9. Menopause is complete when you have missed 12 consecutive menstrual periods. It is important to talk with your health care provider about some of the most common conditions that affect postmenopausal women, such as heart disease, cancer, and bone loss (osteoporosis). Adopting a healthy lifestyle and getting preventive care can help to promote your health and wellness. Those actions can also lower your chances of developing some of these common conditions. What should I know about menopause? During menopause, you may experience a number of symptoms, such as:  Moderate-to-severe hot flashes.  Night sweats.  Decrease in sex drive.  Mood swings.  Headaches.  Tiredness.  Irritability.  Memory problems.  Insomnia.  Choosing to treat or not to treat menopausal changes is an individual decision that you make with your health care provider. What should I know about hormone replacement therapy and supplements? Hormone therapy products are effective for treating symptoms that are associated with menopause, such as hot flashes and night sweats. Hormone replacement carries certain risks, especially as you become older. If you are thinking about using estrogen or estrogen with progestin treatments, discuss the benefits and risks with your health care provider. What should I know about heart disease and stroke? Heart disease, heart attack, and stroke become more likely as you age. This may be due, in part, to the hormonal changes that your body experiences during menopause. These can affect how your body processes dietary fats, triglycerides, and cholesterol. Heart attack and stroke are both medical emergencies. There are many things that you can do to help prevent heart disease  and stroke:  Have your blood pressure checked at least every 1-2 years. High blood pressure causes heart disease and increases the risk of stroke.  If you are 53-22 years old, ask your health care provider if you should take aspirin to prevent a heart attack or a stroke.  Do not use any tobacco products, including cigarettes, chewing tobacco, or electronic cigarettes. If you need help quitting, ask your health care provider.  It is important to eat a healthy diet and maintain a healthy weight. ? Be sure to include plenty of vegetables, fruits, low-fat dairy products, and lean protein. ? Avoid eating foods that are high in solid fats, added sugars, or salt (sodium).  Get regular exercise. This is one of the most important things that you can do for your health. ? Try to exercise for at least 150 minutes each week. The type of exercise that you do should increase your heart rate and make you sweat. This is known as moderate-intensity exercise. ? Try to do strengthening exercises at least twice each week. Do these in addition to the moderate-intensity exercise.  Know your numbers.Ask your health care provider to check your cholesterol and your blood glucose. Continue to have your blood tested as directed by your health care provider.  What should I know about cancer screening? There are several types of cancer. Take the following steps to reduce your risk and to catch any cancer development as early as possible. Breast Cancer  Practice breast self-awareness. ? This means understanding how your breasts normally appear and feel. ? It also means doing regular breast self-exams. Let your health care provider know about any changes, no matter how small.  If you are 40  or older, have a clinician do a breast exam (clinical breast exam or CBE) every year. Depending on your age, family history, and medical history, it may be recommended that you also have a yearly breast X-ray (mammogram).  If you  have a family history of breast cancer, talk with your health care provider about genetic screening.  If you are at high risk for breast cancer, talk with your health care provider about having an MRI and a mammogram every year.  Breast cancer (BRCA) gene test is recommended for women who have family members with BRCA-related cancers. Results of the assessment will determine the need for genetic counseling and BRCA1 and for BRCA2 testing. BRCA-related cancers include these types: ? Breast. This occurs in males or females. ? Ovarian. ? Tubal. This may also be called fallopian tube cancer. ? Cancer of the abdominal or pelvic lining (peritoneal cancer). ? Prostate. ? Pancreatic.  Cervical, Uterine, and Ovarian Cancer Your health care provider may recommend that you be screened regularly for cancer of the pelvic organs. These include your ovaries, uterus, and vagina. This screening involves a pelvic exam, which includes checking for microscopic changes to the surface of your cervix (Pap test).  For women ages 21-65, health care providers may recommend a pelvic exam and a Pap test every three years. For women ages 79-65, they may recommend the Pap test and pelvic exam, combined with testing for human papilloma virus (HPV), every five years. Some types of HPV increase your risk of cervical cancer. Testing for HPV may also be done on women of any age who have unclear Pap test results.  Other health care providers may not recommend any screening for nonpregnant women who are considered low risk for pelvic cancer and have no symptoms. Ask your health care provider if a screening pelvic exam is right for you.  If you have had past treatment for cervical cancer or a condition that could lead to cancer, you need Pap tests and screening for cancer for at least 20 years after your treatment. If Pap tests have been discontinued for you, your risk factors (such as having a new sexual partner) need to be  reassessed to determine if you should start having screenings again. Some women have medical problems that increase the chance of getting cervical cancer. In these cases, your health care provider may recommend that you have screening and Pap tests more often.  If you have a family history of uterine cancer or ovarian cancer, talk with your health care provider about genetic screening.  If you have vaginal bleeding after reaching menopause, tell your health care provider.  There are currently no reliable tests available to screen for ovarian cancer.  Lung Cancer Lung cancer screening is recommended for adults 69-62 years old who are at high risk for lung cancer because of a history of smoking. A yearly low-dose CT scan of the lungs is recommended if you:  Currently smoke.  Have a history of at least 30 pack-years of smoking and you currently smoke or have quit within the past 15 years. A pack-year is smoking an average of one pack of cigarettes per day for one year.  Yearly screening should:  Continue until it has been 15 years since you quit.  Stop if you develop a health problem that would prevent you from having lung cancer treatment.  Colorectal Cancer  This type of cancer can be detected and can often be prevented.  Routine colorectal cancer screening usually begins at  age 42 and continues through age 45.  If you have risk factors for colon cancer, your health care provider may recommend that you be screened at an earlier age.  If you have a family history of colorectal cancer, talk with your health care provider about genetic screening.  Your health care provider may also recommend using home test kits to check for hidden blood in your stool.  A small camera at the end of a tube can be used to examine your colon directly (sigmoidoscopy or colonoscopy). This is done to check for the earliest forms of colorectal cancer.  Direct examination of the colon should be repeated every  5-10 years until age 71. However, if early forms of precancerous polyps or small growths are found or if you have a family history or genetic risk for colorectal cancer, you may need to be screened more often.  Skin Cancer  Check your skin from head to toe regularly.  Monitor any moles. Be sure to tell your health care provider: ? About any new moles or changes in moles, especially if there is a change in a mole's shape or color. ? If you have a mole that is larger than the size of a pencil eraser.  If any of your family members has a history of skin cancer, especially at a young age, talk with your health care provider about genetic screening.  Always use sunscreen. Apply sunscreen liberally and repeatedly throughout the day.  Whenever you are outside, protect yourself by wearing long sleeves, pants, a wide-brimmed hat, and sunglasses.  What should I know about osteoporosis? Osteoporosis is a condition in which bone destruction happens more quickly than new bone creation. After menopause, you may be at an increased risk for osteoporosis. To help prevent osteoporosis or the bone fractures that can happen because of osteoporosis, the following is recommended:  If you are 46-71 years old, get at least 1,000 mg of calcium and at least 600 mg of vitamin D per day.  If you are older than age 55 but younger than age 65, get at least 1,200 mg of calcium and at least 600 mg of vitamin D per day.  If you are older than age 54, get at least 1,200 mg of calcium and at least 800 mg of vitamin D per day.  Smoking and excessive alcohol intake increase the risk of osteoporosis. Eat foods that are rich in calcium and vitamin D, and do weight-bearing exercises several times each week as directed by your health care provider. What should I know about how menopause affects my mental health? Depression may occur at any age, but it is more common as you become older. Common symptoms of depression  include:  Low or sad mood.  Changes in sleep patterns.  Changes in appetite or eating patterns.  Feeling an overall lack of motivation or enjoyment of activities that you previously enjoyed.  Frequent crying spells.  Talk with your health care provider if you think that you are experiencing depression. What should I know about immunizations? It is important that you get and maintain your immunizations. These include:  Tetanus, diphtheria, and pertussis (Tdap) booster vaccine.  Influenza every year before the flu season begins.  Pneumonia vaccine.  Shingles vaccine.  Your health care provider may also recommend other immunizations. This information is not intended to replace advice given to you by your health care provider. Make sure you discuss any questions you have with your health care provider. Document Released: 02/17/2005  Document Revised: 07/16/2015 Document Reviewed: 09/29/2014 Elsevier Interactive Patient Education  2018 [employer]  

## 2017-11-12 NOTE — Addendum Note (Signed)
Addended by: Roena Malady on: 11/12/2017 04:53 PM   Modules accepted: Orders

## 2017-11-15 ENCOUNTER — Other Ambulatory Visit (INDEPENDENT_AMBULATORY_CARE_PROVIDER_SITE_OTHER): Payer: 59

## 2017-11-15 DIAGNOSIS — E875 Hyperkalemia: Secondary | ICD-10-CM

## 2017-11-15 LAB — POTASSIUM: Potassium: 4.2 mEq/L (ref 3.5–5.1)

## 2018-11-11 ENCOUNTER — Other Ambulatory Visit: Payer: Self-pay

## 2018-11-11 ENCOUNTER — Ambulatory Visit (INDEPENDENT_AMBULATORY_CARE_PROVIDER_SITE_OTHER): Payer: 59 | Admitting: Internal Medicine

## 2018-11-11 ENCOUNTER — Encounter: Payer: Self-pay | Admitting: Internal Medicine

## 2018-11-11 VITALS — BP 124/82 | HR 62 | Temp 97.9°F | Ht 64.0 in | Wt 135.0 lb

## 2018-11-11 DIAGNOSIS — Z23 Encounter for immunization: Secondary | ICD-10-CM

## 2018-11-11 DIAGNOSIS — Z1231 Encounter for screening mammogram for malignant neoplasm of breast: Secondary | ICD-10-CM

## 2018-11-11 DIAGNOSIS — N951 Menopausal and female climacteric states: Secondary | ICD-10-CM | POA: Diagnosis not present

## 2018-11-11 DIAGNOSIS — Z1211 Encounter for screening for malignant neoplasm of colon: Secondary | ICD-10-CM

## 2018-11-11 DIAGNOSIS — Z0000 Encounter for general adult medical examination without abnormal findings: Secondary | ICD-10-CM | POA: Diagnosis not present

## 2018-11-11 LAB — COMPREHENSIVE METABOLIC PANEL
ALT: 12 U/L (ref 0–35)
AST: 18 U/L (ref 0–37)
Albumin: 4.9 g/dL (ref 3.5–5.2)
Alkaline Phosphatase: 54 U/L (ref 39–117)
BUN: 14 mg/dL (ref 6–23)
CO2: 28 mEq/L (ref 19–32)
Calcium: 9.5 mg/dL (ref 8.4–10.5)
Chloride: 101 mEq/L (ref 96–112)
Creatinine, Ser: 0.87 mg/dL (ref 0.40–1.20)
GFR: 68.67 mL/min (ref >60.00)
Glucose, Bld: 103 mg/dL — ABNORMAL HIGH (ref 70–99)
Potassium: 4.4 mEq/L (ref 3.5–5.1)
Sodium: 137 mEq/L (ref 135–145)
Total Bilirubin: 0.5 mg/dL (ref 0.2–1.2)
Total Protein: 7.5 g/dL (ref 6.0–8.3)

## 2018-11-11 LAB — LIPID PANEL
Cholesterol: 219 mg/dL — ABNORMAL HIGH (ref 0–200)
HDL: 82.7 mg/dL (ref >39.00)
LDL Cholesterol: 125 mg/dL — ABNORMAL HIGH (ref 0–99)
NonHDL: 136.01
Total CHOL/HDL Ratio: 3
Triglycerides: 55 mg/dL (ref 0.0–149.0)
VLDL: 11 mg/dL (ref 0.0–40.0)

## 2018-11-11 LAB — CBC
HCT: 41 % (ref 36.0–46.0)
Hemoglobin: 13.7 g/dL (ref 12.0–15.0)
MCHC: 33.5 g/dL (ref 30.0–36.0)
MCV: 96.8 fl (ref 78.0–100.0)
Platelets: 192 10*3/uL (ref 150.0–400.0)
RBC: 4.23 Mil/uL (ref 3.87–5.11)
RDW: 12.1 % (ref 11.5–15.5)
WBC: 3.3 10*3/uL — ABNORMAL LOW (ref 4.0–10.5)

## 2018-11-11 LAB — VITAMIN D 25 HYDROXY (VIT D DEFICIENCY, FRACTURES): VITD: 34.45 ng/mL (ref 30.00–100.00)

## 2018-11-11 NOTE — Assessment & Plan Note (Signed)
Stable on Paroxetine and Magnesium Will monitor

## 2018-11-11 NOTE — Progress Notes (Signed)
Established Patient Office Visit  Subjective:  Patient ID: Jill Gay, female    DOB: [birth year]  Age: 53 y.o. MRN: [id number]  HPI Jill Gay presents for her annual exam. She is also due for follow-up of her chronic conditions.   HTN: This was an issue only when she was pregnant. Her BP today is .  PUD:  No issues or concerns History of 20 years ago, no reoccurance.  Urge Incontinence: Better than in the past. Changed her diet which has seemed to help.   Menopausal Symptoms: Controlled on Paxil and takes magnesium at night.   Chronic Back Pain: Takes motrin/Aelve to help with pain symptoms. She did have low back surgery for L5 nerve impingement. She reports she still has low back pain with numbness and cramping of the left lower extremity.  Flu: 10/2018 Tetanus: 2012 Pap Smear: 10/2016 Mammogram: 05/2015, will refer  Colon Screening: 11/2018 Vision Screening: annually Dentist: annually  Diet: She does eat meat. She consumes fruits and veggies daily. She does eat fried foods. She drinks mostly water, red and white wine with coffee. Exercise: Walks about 4 miles 3 X a week.   Past Medical History:  Diagnosis Date  . CHICKENPOX, HX OF   . Gestational hypertension   . PUD (peptic ulcer disease)   . VENEREAL WART     Past Surgical History:  Procedure Laterality Date  . BACK SURGERY  02/2015  . PELVIC TUMOR REMOVED  2006    Family History  Problem Relation Age of Onset  . Alcohol abuse Mother   . Arthritis Mother   . Depression Mother   . Melanoma Sister   . Alcohol abuse Maternal Grandmother   . Arthritis Maternal Grandmother   . Heart disease Maternal Grandmother   . Stroke Maternal Grandmother     Social History   Socioeconomic History  . Marital status: Married    Spouse name: Not on file  . Number of children: 2  . Years of education: Not on file  . Highest education level: Not on file  Occupational History  . Not on file  Social Needs  .  Financial resource strain: Not on file  . Food insecurity    Worry: Not on file    Inability: Not on file  . Transportation needs    Medical: Not on file    Non-medical: Not on file  Tobacco Use  . Smoking status: Former Games developermoker  . Smokeless tobacco: Never Used  . Tobacco comment: quit in 2001  Substance and Sexual Activity  . Alcohol use: Yes    Alcohol/week: 0.0 standard drinks    Comment: social  . Drug use: No  . Sexual activity: Yes  Lifestyle  . Physical activity    Days per week: Not on file    Minutes per session: Not on file  . Stress: Not on file  Relationships  . Social Musicianconnections    Talks on phone: Not on file    Gets together: Not on file    Attends religious service: Not on file    Active member of club or organization: Not on file    Attends meetings of clubs or organizations: Not on file    Relationship status: Not on file  . Intimate partner violence    Fear of current or ex partner: Not on file    Emotionally abused: Not on file    Physically abused: Not on file    Forced sexual activity: Not on file  Other Topics Concern  . Not on file  Social History Narrative  . Not on file    Outpatient Medications Prior to Visit  Medication Sig Dispense Refill  . Ascorbic Acid (VITAMIN C) 1000 MG tablet     . Black Cohosh 540 MG CAPS     . Evening Primrose Oil 1000 MG CAPS Take 2-3 capsules by mouth daily.    Marland Kitchen PARoxetine (PAXIL) 10 MG tablet TAKE 1 TABLET BY MOUTH EVERYDAY AT BEDTIME 90 tablet 3  . Turmeric 500 MG CAPS     . vitamin E 200 UNIT capsule      No facility-administered medications prior to visit.     Allergies  Allergen Reactions  . Bactrim [Sulfamethoxazole-Trimethoprim] Swelling    Blisters, erythema to skin  . Doxycycline Hives    Joint pains    ROS   Constitutional: Denies fever, malaise, fatigue, headache or abrupt weight changes.  HEENT: Denies eye pain, eye redness, ear pain, ringing in the ears, wax buildup, runny nose, nasal  congestion, bloody nose, or sore throat. Respiratory: Denies difficulty breathing, shortness of breath, cough or sputum production.   Cardiovascular: Denies chest pain, chest tightness, palpitations or swelling in the hands or feet.  Gastrointestinal: Denies abdominal pain, bloating, constipation, diarrhea or blood in the stool.  GU: Pt reports urge incontinence. Denies frequency, urgency, pain with urination, blood in urine, odor or discharge. Musculoskeletal: Pt reports low back pain. Denies decrease in range of motion, difficulty with gait, muscle pain or joint swelling.  Skin: Denies redness, rashes, lesions or ulcercations.  Neurological: Pt reports numbness of her left lower extremity. Denies dizziness, difficulty with memory, difficulty with speech or problems with balance and coordination.  Psych: Denies anxiety, depression, SI/HI. Review of Systems    Objective:    Physical Exam   General: Appears her stated age, well developed, well nourished in NAD. Skin: 2 in scar noted over lumbar spine.  HEENT: Head: normal shape and size; Eyes: sclera white, no icterus, conjunctiva pink, PERRLA and EOMs intact; Ears: Tm's gray and intact, normal light reflex;Throat/Mouth: Teeth present, mucosa pink and moist, no lesions or ulcerations noted.  Neck: Neck supple, trachea midline. No masses, lumps or thyromegaly present.  Cardiovascular: Normal rate and rhythm. S1,S2 noted.  No murmur, rubs or gallops noted. No JVD or BLE edema. No carotid bruits noted. Pulmonary/Chest: Normal effort and positive vesicular breath sounds. No respiratory distress. No wheezes, rales or ronchi noted.  Abdomen: Soft and nontender. Normal bowel sounds. No distention or masses noted.  Musculoskeletal: Normal flexion, extension and rotation of the spine. Bony tenderness noted over the lumbar spine. Strength 5/5 BUE. Strength 5/5 RLE, 4/5 LLE. No signs of joint swelling. No difficulty with gait.  Neurological: Alert and  oriented. Cranial nerves II-XII grossly intact. Coordination normal.  Psychiatric: Mood and affect normal. Behavior is normal. Judgment and thought content normal.   There were no vitals taken for this visit. Wt Readings from Last 3 Encounters:  11/08/17 62.1 kg  12/28/16 61.7 kg  11/17/15 61 kg     Health Maintenance Due  Topic Date Due  . MAMMOGRAM  12/21/2017  . COLONOSCOPY  12/21/2017  . INFLUENZA VACCINE  08/10/2018    There are no preventive care reminders to display for this patient.  Lab Results  Component Value Date   TSH 1.45 11/06/2016   Lab Results  Component Value Date   WBC 3.2 (L) 11/08/2017   HGB 14.1 11/08/2017   HCT 41.0  11/08/2017   MCV 94.6 11/08/2017   PLT 182.0 11/08/2017   Lab Results  Component Value Date   NA 140 11/08/2017   K 4.2 11/15/2017   CO2 32 11/08/2017   GLUCOSE 105 (H) 11/08/2017   BUN 20 11/08/2017   CREATININE 0.79 11/08/2017   BILITOT 0.5 11/08/2017   ALKPHOS 57 11/08/2017   AST 19 11/08/2017   ALT 14 11/08/2017   PROT 7.4 11/08/2017   ALBUMIN 4.8 11/08/2017   CALCIUM 9.5 11/08/2017   GFR 81.92 11/08/2017   Lab Results  Component Value Date   CHOL 185 11/08/2017   Lab Results  Component Value Date   HDL 80.70 11/08/2017   Lab Results  Component Value Date   LDLCALC 97 11/08/2017   Lab Results  Component Value Date   TRIG 37.0 11/08/2017   Lab Results  Component Value Date   CHOLHDL 2 11/08/2017   No results found for: HGBA1C    Assessment & Plan:   Preventative Health Maintenance:  Flu shot today Tetanus UTD Pap Smear UTD Mammogram ordered 3D mammogram Encouraged her to continue to consume a healthy diet and exercise regimen Encouraged her to see an eye doctor and dentist annually Will check CBC, CMET and Lipid Profile today   RTC in 1 year for your annual exam Problem List Items Addressed This Visit    None      No orders of the defined types were placed in this encounter.    Follow-up: No follow-ups on file.    Leward Quan, Student-PA

## 2018-11-11 NOTE — Patient Instructions (Signed)
Health Maintenance, Female Adopting a healthy lifestyle and getting preventive care are important in promoting health and wellness. Ask your health care provider about:  The right schedule for you to have regular tests and exams.  Things you can do on your own to prevent diseases and keep yourself healthy. What should I know about diet, weight, and exercise? Eat a healthy diet   Eat a diet that includes plenty of vegetables, fruits, low-fat dairy products, and lean protein.  Do not eat a lot of foods that are high in solid fats, added sugars, or sodium. Maintain a healthy weight Body mass index (BMI) is used to identify weight problems. It estimates body fat based on height and weight. Your health care provider can help determine your BMI and help you achieve or maintain a healthy weight. Get regular exercise Get regular exercise. This is one of the most important things you can do for your health. Most adults should:  Exercise for at least 150 minutes each week. The exercise should increase your heart rate and make you sweat (moderate-intensity exercise).  Do strengthening exercises at least twice a week. This is in addition to the moderate-intensity exercise.  Spend less time sitting. Even light physical activity can be beneficial. Watch cholesterol and blood lipids Have your blood tested for lipids and cholesterol at 51 years of age, then have this test every 5 years. Have your cholesterol levels checked more often if:  Your lipid or cholesterol levels are high.  You are older than 51 years of age.  You are at high risk for heart disease. What should I know about cancer screening? Depending on your health history and family history, you may need to have cancer screening at various ages. This may include screening for:  Breast cancer.  Cervical cancer.  Colorectal cancer.  Skin cancer.  Lung cancer. What should I know about heart disease, diabetes, and high blood  pressure? Blood pressure and heart disease  High blood pressure causes heart disease and increases the risk of stroke. This is more likely to develop in people who have high blood pressure readings, are of African descent, or are overweight.  Have your blood pressure checked: ? Every 3-5 years if you are 18-39 years of age. ? Every year if you are 40 years old or older. Diabetes Have regular diabetes screenings. This checks your fasting blood sugar level. Have the screening done:  Once every three years after age 40 if you are at a normal weight and have a low risk for diabetes.  More often and at a younger age if you are overweight or have a high risk for diabetes. What should I know about preventing infection? Hepatitis B If you have a higher risk for hepatitis B, you should be screened for this virus. Talk with your health care provider to find out if you are at risk for hepatitis B infection. Hepatitis C Testing is recommended for:  Everyone born from 1945 through 1965.  Anyone with known risk factors for hepatitis C. Sexually transmitted infections (STIs)  Get screened for STIs, including gonorrhea and chlamydia, if: ? You are sexually active and are younger than 51 years of age. ? You are older than 51 years of age and your health care provider tells you that you are at risk for this type of infection. ? Your sexual activity has changed since you were last screened, and you are at increased risk for chlamydia or gonorrhea. Ask your health care provider if   you are at risk.  Ask your health care provider about whether you are at high risk for HIV. Your health care provider may recommend a prescription medicine to help prevent HIV infection. If you choose to take medicine to prevent HIV, you should first get tested for HIV. You should then be tested every 3 months for as long as you are taking the medicine. Pregnancy  If you are about to stop having your period (premenopausal) and  you may become pregnant, seek counseling before you get pregnant.  Take 400 to 800 micrograms (mcg) of folic acid every day if you become pregnant.  Ask for birth control (contraception) if you want to prevent pregnancy. Osteoporosis and menopause Osteoporosis is a disease in which the bones lose minerals and strength with aging. This can result in bone fractures. If you are 65 years old or older, or if you are at risk for osteoporosis and fractures, ask your health care provider if you should:  Be screened for bone loss.  Take a calcium or vitamin D supplement to lower your risk of fractures.  Be given hormone replacement therapy (HRT) to treat symptoms of menopause. Follow these instructions at home: Lifestyle  Do not use any products that contain nicotine or tobacco, such as cigarettes, e-cigarettes, and chewing tobacco. If you need help quitting, ask your health care provider.  Do not use street drugs.  Do not share needles.  Ask your health care provider for help if you need support or information about quitting drugs. Alcohol use  Do not drink alcohol if: ? Your health care provider tells you not to drink. ? You are pregnant, may be pregnant, or are planning to become pregnant.  If you drink alcohol: ? Limit how much you use to 0-1 drink a day. ? Limit intake if you are breastfeeding.  Be aware of how much alcohol is in your drink. In the [place] one drink equals one 12 oz bottle of beer (355 mL), one 5 oz glass of wine (148 mL), or one 1 oz glass of hard liquor (44 mL). General instructions  Schedule regular health, dental, and eye exams.  Stay current with your vaccines.  Tell your health care provider if: ? You often feel depressed. ? You have ever been abused or do not feel safe at home. Summary  Adopting a healthy lifestyle and getting preventive care are important in promoting health and wellness.  Follow your health care provider's instructions about healthy  diet, exercising, and getting tested or screened for diseases.  Follow your health care provider's instructions on monitoring your cholesterol and blood pressure. This information is not intended to replace advice given to you by your health care provider. Make sure you discuss any questions you have with your health care provider. Document Released: 07/11/2010 Document Revised: 12/19/2017 Document Reviewed: 12/19/2017 Elsevier Patient Education  2020 Elsevier Inc[employer]  

## 2018-11-11 NOTE — Progress Notes (Signed)
Subjective:    Patient ID: Jill Gay, female    DOB: 1965/07/29, 53 y.o.   MRN: [id number]  HPI  Pt presents to the clinic today for her annual exam.   Menopausal Symptoms: Stable on Paroxetine and Magnesium. She is no longer taking [employer].   Flu: 10/2017 Tetanus: 11/2010 Pap Smear: 10/2016 Mammogram: 05/2015 Vision Screening: annually Dentist: biannually  Diet: She does eat meat. She consumes fruits and veggies daily. She does not eat fried foods. She drinks mostly coffee and water. Exercise: Walking 4 miles 3-4 days per week.  Review of Systems  Past Medical History:  Diagnosis Date  . CHICKENPOX, HX OF   . Gestational hypertension   . PUD (peptic ulcer disease)   . VENEREAL WART     Current Outpatient Medications  Medication Sig Dispense Refill  . Magnesium 80 MG TABS Take 160 mg by mouth at bedtime.    Marland Kitchen PARoxetine (PAXIL) 10 MG tablet TAKE 1 TABLET BY MOUTH EVERYDAY AT BEDTIME 90 tablet 3   No current facility-administered medications for this visit.     Allergies  Allergen Reactions  . Bactrim [Sulfamethoxazole-Trimethoprim] Swelling    Blisters, erythema to skin  . Doxycycline Hives    Joint pains    Family History  Problem Relation Age of Onset  . Alcohol abuse Mother   . Arthritis Mother   . Depression Mother   . Melanoma Sister   . Alcohol abuse Maternal Grandmother   . Arthritis Maternal Grandmother   . Heart disease Maternal Grandmother   . Stroke Maternal Grandmother     Social History   Socioeconomic History  . Marital status: Married    Spouse name: Not on file  . Number of children: 2  . Years of education: Not on file  . Highest education level: Not on file  Occupational History  . Not on file  Social Needs  . Financial resource strain: Not on file  . Food insecurity    Worry: Not on file    Inability: Not on file  . Transportation needs    Medical: Not on file    Non-medical: Not on file  Tobacco Use  . Smoking  status: Former Research scientist (life sciences)  . Smokeless tobacco: Never Used  . Tobacco comment: quit in 2001  Substance and Sexual Activity  . Alcohol use: Yes    Alcohol/week: 0.0 standard drinks    Comment: social  . Drug use: No  . Sexual activity: Yes  Lifestyle  . Physical activity    Days per week: Not on file    Minutes per session: Not on file  . Stress: Not on file  Relationships  . Social Herbalist on phone: Not on file    Gets together: Not on file    Attends religious service: Not on file    Active member of club or organization: Not on file    Attends meetings of clubs or organizations: Not on file    Relationship status: Not on file  . Intimate partner violence    Fear of current or ex partner: Not on file    Emotionally abused: Not on file    Physically abused: Not on file    Forced sexual activity: Not on file  Other Topics Concern  . Not on file  Social History Narrative  . Not on file     Constitutional: Denies fever, malaise, fatigue, headache or abrupt weight changes.  HEENT: Denies eye pain, eye  redness, ear pain, ringing in the ears, wax buildup, runny nose, nasal congestion, bloody nose, or sore throat. Respiratory: Denies difficulty breathing, shortness of breath, cough or sputum production.   Cardiovascular: Denies chest pain, chest tightness, palpitations or swelling in the hands or feet.  Gastrointestinal: Denies abdominal pain, bloating, constipation, diarrhea or blood in the stool.  GU: Denies urgency, frequency, pain with urination, burning sensation, blood in urine, odor or discharge. Musculoskeletal: Denies decrease in range of motion, difficulty with gait, muscle pain or joint pain and swelling.  Skin: Denies redness, rashes, lesions or ulcercations.  Neurological: Denies dizziness, difficulty with memory, difficulty with speech or problems with balance and coordination.  Psych: Denies anxiety, depression, SI/HI.  No other specific complaints in a  complete review of systems (except as listed in HPI above).     Objective:   Physical Exam   BP 124/82   Pulse 62   Temp 97.9 F (36.6 C) (Temporal)   Ht 5\' 4"  (1.626 m)   Wt 135 lb (61.2 kg)   SpO2 100%   BMI 23.17 kg/m  Wt Readings from Last 3 Encounters:  11/11/18 135 lb (61.2 kg)  11/08/17 137 lb (62.1 kg)  12/28/16 136 lb (61.7 kg)    General: Appears her stated age, well developed, well nourished in NAD. Skin: Warm, dry and intact. No rashes noted. HEENT: Head: normal shape and size; Eyes: sclera white, no icterus, conjunctiva pink, PERRLA and EOMs intact; Ears: Tm's gray and intact, normal light reflex;  Neck:  Neck supple, trachea midline. No masses, lumps or thyromegaly present.  Cardiovascular: Normal rate and rhythm. S1,S2 noted.  No murmur, rubs or gallops noted. No JVD or BLE edema. No carotid bruits noted. Pulmonary/Chest: Normal effort and positive vesicular breath sounds. No respiratory distress. No wheezes, rales or ronchi noted.  Abdomen: Soft and nontender. Normal bowel sounds. No distention or masses noted. Liver, spleen and kidneys non palpable. Musculoskeletal: Strength 5/5 BUE/BLE. No difficulty with gait.  Neurological: Alert and oriented. Cranial nerves II-XII grossly intact. Coordination normal.  Psychiatric: Mood and affect normal. Behavior is normal. Judgment and thought content normal.     BMET    Component Value Date/Time   NA 140 11/08/2017 0836   K 4.2 11/15/2017 0801   CL 103 11/08/2017 0836   CO2 32 11/08/2017 0836   GLUCOSE 105 (H) 11/08/2017 0836   BUN 20 11/08/2017 0836   CREATININE 0.79 11/08/2017 0836   CALCIUM 9.5 11/08/2017 0836   GFRNONAA 84.20 07/15/2008 0846    Lipid Panel     Component Value Date/Time   CHOL 185 11/08/2017 0836   TRIG 37.0 11/08/2017 0836   HDL 80.70 11/08/2017 0836   CHOLHDL 2 11/08/2017 0836   VLDL 7.4 11/08/2017 0836   LDLCALC 97 11/08/2017 0836    CBC    Component Value Date/Time   WBC 3.2  (L) 11/08/2017 0836   RBC 4.34 11/08/2017 0836   HGB 14.1 11/08/2017 0836   HCT 41.0 11/08/2017 0836   PLT 182.0 11/08/2017 0836   MCV 94.6 11/08/2017 0836   MCH 32.4 11/17/2015 1455   MCHC 34.3 11/08/2017 0836   RDW 12.7 11/08/2017 0836   LYMPHSABS 1.2 11/17/2015 1455   MONOABS 0.2 11/17/2015 1455   EOSABS 0.0 11/17/2015 1455   BASOSABS 0.0 11/17/2015 1455    Hgb A1C No results found for: HGBA1C         Assessment & Plan:   Preventative Health Maintenance:  Flu shot today  Tetanus UTD Pap smear UTD Mammogram ordered, she will call [facility] to schedule She declines colonoscopy but is agreeable to Cologuard- she will check with insurance Encouraged her to consume a balanced diet and exercise regimen Advised her to see an eye doctor and dentist annually Will check CBC, CMET, Lipid, and A1C today  RTC in 1 year, sooner if needed Nicki Reaper, NP

## 2018-11-13 ENCOUNTER — Encounter: Payer: Self-pay | Admitting: Internal Medicine

## 2018-11-13 NOTE — Addendum Note (Signed)
Addended by: Lurlean Nanny on: 11/13/2018 11:08 AM   Modules accepted: Orders

## 2018-12-27 ENCOUNTER — Other Ambulatory Visit: Payer: Self-pay | Admitting: Internal Medicine

## 2018-12-27 DIAGNOSIS — N951 Menopausal and female climacteric states: Secondary | ICD-10-CM

## 2019-02-24 ENCOUNTER — Ambulatory Visit
Admission: RE | Admit: 2019-02-24 | Discharge: 2019-02-24 | Disposition: A | Discharge status: Home / Self Care | Payer: 59 | Source: Ambulatory Visit | Attending: Internal Medicine | Admitting: Internal Medicine

## 2019-02-24 DIAGNOSIS — Z1231 Encounter for screening mammogram for malignant neoplasm of breast: Secondary | ICD-10-CM | POA: Diagnosis not present

## 2019-09-25 ENCOUNTER — Other Ambulatory Visit: Payer: Self-pay | Admitting: Internal Medicine

## 2019-09-25 DIAGNOSIS — N951 Menopausal and female climacteric states: Secondary | ICD-10-CM

## 2019-11-13 ENCOUNTER — Other Ambulatory Visit: Payer: Self-pay

## 2019-11-13 ENCOUNTER — Ambulatory Visit (INDEPENDENT_AMBULATORY_CARE_PROVIDER_SITE_OTHER): Payer: 59 | Admitting: Internal Medicine

## 2019-11-13 ENCOUNTER — Encounter: Payer: Self-pay | Admitting: Internal Medicine

## 2019-11-13 VITALS — BP 122/78 | HR 62 | Temp 97.3°F | Resp 16 | Ht 64.5 in | Wt 141.0 lb

## 2019-11-13 DIAGNOSIS — Z0000 Encounter for general adult medical examination without abnormal findings: Secondary | ICD-10-CM | POA: Diagnosis not present

## 2019-11-13 DIAGNOSIS — Z1211 Encounter for screening for malignant neoplasm of colon: Secondary | ICD-10-CM

## 2019-11-13 DIAGNOSIS — Z23 Encounter for immunization: Secondary | ICD-10-CM | POA: Diagnosis not present

## 2019-11-13 DIAGNOSIS — E559 Vitamin D deficiency, unspecified: Secondary | ICD-10-CM | POA: Diagnosis not present

## 2019-11-13 DIAGNOSIS — N951 Menopausal and female climacteric states: Secondary | ICD-10-CM | POA: Diagnosis not present

## 2019-11-13 DIAGNOSIS — L700 Acne vulgaris: Secondary | ICD-10-CM | POA: Diagnosis not present

## 2019-11-13 LAB — CBC
HCT: 39.5 % (ref 36.0–46.0)
Hemoglobin: 13.2 g/dL (ref 12.0–15.0)
MCHC: 33.5 g/dL (ref 30.0–36.0)
MCV: 95.4 fl (ref 78.0–100.0)
Platelets: 216 10*3/uL (ref 150.0–400.0)
RBC: 4.14 Mil/uL (ref 3.87–5.11)
RDW: 11.9 % (ref 11.5–15.5)
WBC: 3.9 10*3/uL — ABNORMAL LOW (ref 4.0–10.5)

## 2019-11-13 LAB — COMPREHENSIVE METABOLIC PANEL
ALT: 15 U/L (ref 0–35)
AST: 17 U/L (ref 0–37)
Albumin: 4.8 g/dL (ref 3.5–5.2)
Alkaline Phosphatase: 49 U/L (ref 39–117)
BUN: 26 mg/dL — ABNORMAL HIGH (ref 6–23)
CO2: 27 mEq/L (ref 19–32)
Calcium: 9.3 mg/dL (ref 8.4–10.5)
Chloride: 104 mEq/L (ref 96–112)
Creatinine, Ser: 0.86 mg/dL (ref 0.40–1.20)
GFR: 77.96 mL/min (ref >60.00)
Glucose, Bld: 96 mg/dL (ref 70–99)
Potassium: 4.3 mEq/L (ref 3.5–5.1)
Sodium: 139 mEq/L (ref 135–145)
Total Bilirubin: 0.3 mg/dL (ref 0.2–1.2)
Total Protein: 7.3 g/dL (ref 6.0–8.3)

## 2019-11-13 LAB — LIPID PANEL
Cholesterol: 206 mg/dL — ABNORMAL HIGH (ref 0–200)
HDL: 82.3 mg/dL (ref >39.00)
LDL Cholesterol: 112 mg/dL — ABNORMAL HIGH (ref 0–99)
NonHDL: 123.24
Total CHOL/HDL Ratio: 2
Triglycerides: 58 mg/dL (ref 0.0–149.0)
VLDL: 11.6 mg/dL (ref 0.0–40.0)

## 2019-11-13 LAB — VITAMIN D 25 HYDROXY (VIT D DEFICIENCY, FRACTURES): VITD: 20.73 ng/mL — ABNORMAL LOW (ref 30.00–100.00)

## 2019-11-13 MED ORDER — SPIRONOLACTONE 25 MG PO TABS: 25.0000 mg | ORAL_TABLET | Freq: Every day | ORAL | 5 refills | Status: DC

## 2019-11-13 MED ORDER — VITAMIN D (ERGOCALCIFEROL) 1.25 MG (50000 UNIT) PO CAPS: 50000.0000 [IU] | ORAL_CAPSULE | ORAL | 0 refills | Status: DC

## 2019-11-13 NOTE — Progress Notes (Signed)
Subjective:    Patient ID: Jill Gay, female    DOB: [birth year], 54 y.o.   MRN: [id number]  HPI  Pt presents to the clinic today for her annual exam.  Menopausal Symptoms: Mainly mood swings, hot flashes. Stable on Paroxetine and Magnesium.  Flu: 11/2018 Tetanus: 11/2010 Covid: never Pap Smear: 10/2016 Mammogram: 02/2019 Colon Screening: never Vision Screening: yearly Dentist: biannually  Diet: She does eat meat. She consumes fruits and veggies daily. She tries to avoid fried foods. She drinks mostly water. Exercise: Yoga  Review of Systems      Past Medical History:  Diagnosis Date  . CHICKENPOX, HX OF   . Gestational hypertension   . PUD (peptic ulcer disease)   . VENEREAL WART     Current Outpatient Medications  Medication Sig Dispense Refill  . Magnesium 80 MG TABS Take 160 mg by mouth at bedtime.    Marland Kitchen PARoxetine (PAXIL) 10 MG tablet TAKE 1 TABLET BY MOUTH EVERYDAY AT BEDTIME 90 tablet 0   No current facility-administered medications for this visit.    Allergies  Allergen Reactions  . Bactrim [Sulfamethoxazole-Trimethoprim] Swelling    Blisters, erythema to skin  . Doxycycline Hives    Joint pains    Family History  Problem Relation Age of Onset  . Alcohol abuse Mother   . Arthritis Mother   . Depression Mother   . Melanoma Sister   . Alcohol abuse Maternal Grandmother   . Arthritis Maternal Grandmother   . Heart disease Maternal Grandmother   . Stroke Maternal Grandmother     Social History   Socioeconomic History  . Marital status: Married    Spouse name: Not on file  . Number of children: 2  . Years of education: Not on file  . Highest education level: Not on file  Occupational History  . Not on file  Tobacco Use  . Smoking status: Former Games developer  . Smokeless tobacco: Never Used  . Tobacco comment: quit in 2001  Substance and Sexual Activity  . Alcohol use: Yes    Alcohol/week: 0.0 standard drinks    Comment: social  . Drug  use: No  . Sexual activity: Yes  Other Topics Concern  . Not on file  Social History Narrative  . Not on file   Social Determinants of Health   Financial Resource Strain:   . Difficulty of Paying Living Expenses: Not on file  Food Insecurity:   . Worried About Programme researcher, broadcasting/film/video in the Last Year: Not on file  . Ran Out of Food in the Last Year: Not on file  Transportation Needs:   . Lack of Transportation (Medical): Not on file  . Lack of Transportation (Non-Medical): Not on file  Physical Activity:   . Days of Exercise per Week: Not on file  . Minutes of Exercise per Session: Not on file  Stress:   . Feeling of Stress : Not on file  Social Connections:   . Frequency of Communication with Friends and Family: Not on file  . Frequency of Social Gatherings with Friends and Family: Not on file  . Attends Religious Services: Not on file  . Active Member of Clubs or Organizations: Not on file  . Attends Banker Meetings: Not on file  . Marital Status: Not on file  Intimate Partner Violence:   . Fear of Current or Ex-Partner: Not on file  . Emotionally Abused: Not on file  . Physically Abused: Not on file  .  Sexually Abused: Not on file     Constitutional: Denies fever, malaise, fatigue, headache or abrupt weight changes.  HEENT: Denies eye pain, eye redness, ear pain, ringing in the ears, wax buildup, runny nose, nasal congestion, bloody nose, or sore throat. Respiratory: Denies difficulty breathing, shortness of breath, cough or sputum production.   Cardiovascular: Denies chest pain, chest tightness, palpitations or swelling in the hands or feet.  Gastrointestinal: Denies abdominal pain, bloating, constipation, diarrhea or blood in the stool.  GU: Denies urgency, frequency, pain with urination, burning sensation, blood in urine, odor or discharge. Musculoskeletal: Denies decrease in range of motion, difficulty with gait, muscle pain or joint pain and swelling.    Skin: Pt reports cystic acne. Denies redness, rashes or ulcercations.  Neurological: Denies dizziness, difficulty with memory, difficulty with speech or problems with balance and coordination.  Psych: Denies anxiety, depression, SI/HI.  No other specific complaints in a complete review of systems (except as listed in HPI above).  Objective:   Physical Exam  BP 122/78 (BP Location: Left Arm, Patient Position: Sitting, Cuff Size: Small)   Pulse 62   Temp (!) 97.3 F (36.3 C) (Temporal)   Resp 16   Ht 5' 4.5" (1.638 m)   Wt 141 lb (64 kg)   LMP 12/31/2014   BMI 23.83 kg/m   Wt Readings from Last 3 Encounters:  11/11/18 135 lb (61.2 kg)  11/08/17 137 lb (62.1 kg)  12/28/16 136 lb (61.7 kg)    General: Appears her stated age, well developed, well nourished in NAD. Skin: Warm, dry and intact. No acne noted at this time. HEENT: Head: normal shape and size; Eyes: sclera white, no icterus, conjunctiva pink, PERRLA and EOMs intact;  Neck:  Neck supple, trachea midline. No masses, lumps or thyromegaly present.  Cardiovascular: Normal rate and rhythm. S1,S2 noted.  No murmur, rubs or gallops noted. No JVD or BLE edema. No carotid bruits noted. Pulmonary/Chest: Normal effort and positive vesicular breath sounds. No respiratory distress. No wheezes, rales or ronchi noted.  Abdomen: Soft and nontender. Normal bowel sounds. No distention or masses noted. Liver, spleen and kidneys non palpable. Musculoskeletal: Strength 5/5 BUE/BLE. No difficulty with gait.  Neurological: Alert and oriented. Cranial nerves II-XII grossly intact. Coordination normal.  Psychiatric: Mood and affect normal. Behavior is normal. Judgment and thought content normal.    BMET    Component Value Date/Time   NA 137 11/11/2018 0844   K 4.4 11/11/2018 0844   CL 101 11/11/2018 0844   CO2 28 11/11/2018 0844   GLUCOSE 103 (H) 11/11/2018 0844   BUN 14 11/11/2018 0844   CREATININE 0.87 11/11/2018 0844   CALCIUM 9.5  11/11/2018 0844   GFRNONAA 84.20 07/15/2008 0846    Lipid Panel     Component Value Date/Time   CHOL 219 (H) 11/11/2018 0844   TRIG 55.0 11/11/2018 0844   HDL 82.70 11/11/2018 0844   CHOLHDL 3 11/11/2018 0844   VLDL 11.0 11/11/2018 0844   LDLCALC 125 (H) 11/11/2018 0844    CBC    Component Value Date/Time   WBC 3.3 (L) 11/11/2018 0844   RBC 4.23 11/11/2018 0844   HGB 13.7 11/11/2018 0844   HCT 41.0 11/11/2018 0844   PLT 192.0 11/11/2018 0844   MCV 96.8 11/11/2018 0844   MCH 32.4 11/17/2015 1455   MCHC 33.5 11/11/2018 0844   RDW 12.1 11/11/2018 0844   LYMPHSABS 1.2 11/17/2015 1455   MONOABS 0.2 11/17/2015 1455   EOSABS 0.0 11/17/2015  1455   BASOSABS 0.0 11/17/2015 1455    Hgb A1C No results found for: HGBA1C          Assessment & Plan:   Preventative Health Maintenance:  Flu shot today Tetanus UTD Encouraged her to get a Covid vaccine Pap smear UTD Mammogram UTD Cologuard ordered- will refax, she reports it was never sent to her home last time Encouraged her to consume a balanced diet and exercise regimen Advised her to see an eye doctor and dentist annually Will check CBC, CMET, Lipid and Vit D today  RTC in 1 year, sooner if needed Nicki Reaper, NP This visit occurred during the SARS-CoV-2 public health emergency.  Safety protocols were in place, including screening questions prior to the visit, additional usage of staff PPE, and extensive cleaning of exam room while observing appropriate contact time as indicated for disinfecting solutions.

## 2019-11-13 NOTE — Addendum Note (Signed)
Addended by: Lorre Munroe on: 11/13/2019 08:16 PM   Modules accepted: Orders

## 2019-11-13 NOTE — Assessment & Plan Note (Signed)
Will continue Paroxetine and Magnesium She considered weaning this to see if it would help her lose 5-10 lbs, but after discussion, decided to continue at this time Will monitor

## 2019-11-13 NOTE — Assessment & Plan Note (Signed)
Will trial Spironolactone 25 mg daily, RX sent to pharmacy

## 2019-11-13 NOTE — Patient Instructions (Signed)

## 2019-11-20 ENCOUNTER — Encounter: Payer: Self-pay | Admitting: Internal Medicine

## 2019-12-15 ENCOUNTER — Encounter: Payer: Self-pay | Admitting: Internal Medicine

## 2019-12-25 ENCOUNTER — Other Ambulatory Visit: Payer: Self-pay | Admitting: Internal Medicine

## 2019-12-25 DIAGNOSIS — N951 Menopausal and female climacteric states: Secondary | ICD-10-CM

## 2020-01-04 LAB — COLOGUARD: Cologuard: NEGATIVE

## 2020-01-14 LAB — COLOGUARD: COLOGUARD: NEGATIVE

## 2020-01-30 ENCOUNTER — Other Ambulatory Visit: Payer: Self-pay | Admitting: Internal Medicine

## 2020-02-05 ENCOUNTER — Other Ambulatory Visit (INDEPENDENT_AMBULATORY_CARE_PROVIDER_SITE_OTHER): Payer: 59

## 2020-02-05 ENCOUNTER — Other Ambulatory Visit: Payer: Self-pay | Admitting: Internal Medicine

## 2020-02-05 ENCOUNTER — Other Ambulatory Visit: Payer: Self-pay

## 2020-02-05 DIAGNOSIS — E559 Vitamin D deficiency, unspecified: Secondary | ICD-10-CM | POA: Diagnosis not present

## 2020-02-05 DIAGNOSIS — Z1231 Encounter for screening mammogram for malignant neoplasm of breast: Secondary | ICD-10-CM

## 2020-02-06 ENCOUNTER — Encounter: Payer: Self-pay | Admitting: Internal Medicine

## 2020-02-06 LAB — VITAMIN D 25 HYDROXY (VIT D DEFICIENCY, FRACTURES): VITD: 62.94 ng/mL (ref 30.00–100.00)

## 2020-02-25 ENCOUNTER — Other Ambulatory Visit: Payer: Self-pay

## 2020-02-25 ENCOUNTER — Ambulatory Visit
Admission: RE | Admit: 2020-02-25 | Discharge: 2020-02-25 | Disposition: A | Discharge status: Home / Self Care | Payer: 59 | Source: Ambulatory Visit | Attending: Internal Medicine | Admitting: Internal Medicine

## 2020-02-25 DIAGNOSIS — Z1231 Encounter for screening mammogram for malignant neoplasm of breast: Secondary | ICD-10-CM | POA: Diagnosis present

## 2020-05-14 ENCOUNTER — Other Ambulatory Visit: Payer: Self-pay | Admitting: Internal Medicine

## 2020-05-18 ENCOUNTER — Other Ambulatory Visit: Payer: Self-pay

## 2020-05-18 MED ORDER — SPIRONOLACTONE 25 MG PO TABS: 25.0000 mg | ORAL_TABLET | Freq: Every day | ORAL | 0 refills | Status: DC

## 2020-08-28 ENCOUNTER — Other Ambulatory Visit: Payer: Self-pay | Admitting: Family

## 2020-09-01 ENCOUNTER — Other Ambulatory Visit: Payer: Self-pay

## 2020-09-01 ENCOUNTER — Ambulatory Visit: Payer: 59 | Admitting: Family Medicine

## 2020-09-01 VITALS — BP 136/82 | HR 81 | Temp 97.8°F | Ht 64.5 in | Wt 135.4 lb

## 2020-09-01 DIAGNOSIS — L700 Acne vulgaris: Secondary | ICD-10-CM | POA: Diagnosis not present

## 2020-09-01 DIAGNOSIS — T50905A Adverse effect of unspecified drugs, medicaments and biological substances, initial encounter: Secondary | ICD-10-CM | POA: Insufficient documentation

## 2020-09-01 DIAGNOSIS — N951 Menopausal and female climacteric states: Secondary | ICD-10-CM

## 2020-09-01 DIAGNOSIS — T50905S Adverse effect of unspecified drugs, medicaments and biological substances, sequela: Secondary | ICD-10-CM | POA: Diagnosis not present

## 2020-09-01 LAB — BASIC METABOLIC PANEL
BUN: 11 mg/dL (ref 6–23)
CO2: 28 mEq/L (ref 19–32)
Calcium: 9.7 mg/dL (ref 8.4–10.5)
Chloride: 102 mEq/L (ref 96–112)
Creatinine, Ser: 0.9 mg/dL (ref 0.40–1.20)
GFR: 73.41 mL/min (ref >60.00)
Glucose, Bld: 102 mg/dL — ABNORMAL HIGH (ref 70–99)
Potassium: 4.2 mEq/L (ref 3.5–5.1)
Sodium: 140 mEq/L (ref 135–145)

## 2020-09-01 MED ORDER — SPIRONOLACTONE 25 MG PO TABS: 25.0000 mg | ORAL_TABLET | Freq: Every day | ORAL | 3 refills | Status: DC

## 2020-09-01 MED ORDER — PAROXETINE HCL 10 MG PO TABS: ORAL_TABLET | ORAL | 3 refills | Status: DC

## 2020-09-01 NOTE — Assessment & Plan Note (Signed)
Bmp for spinonolactone today

## 2020-09-01 NOTE — Progress Notes (Signed)
Subjective:    Patient ID: Jill Gay, female    DOB: 05-Apr-1965, 55 y.o.   MRN: [id number]  This visit occurred during the SARS-CoV-2 public health emergency.  Safety protocols were in place, including screening questions prior to the visit, additional usage of staff PPE, and extensive cleaning of exam room while observing appropriate contact time as indicated for disinfecting solutions.   HPI 55 yo pt of NP Baity presents for medication refill /chronic health problems including menopausal symptoms and cystic acne   Wt Readings from Last 3 Encounters:  09/01/20 135 lb 7 oz (61.4 kg)  11/13/19 141 lb (64 kg)  11/11/18 135 lb (61.2 kg)   22.89 kg/m  Pt takes paxil 10 mg daily helped originally with mood and vasomotor  It kills her libido  Has been on it 2 years   Before had hot flashes  Night sweats  Sleep was interrupted  Vaginal dryness-has this  No libido  Also mood change (multi factorial)   Only knows 1/2 of her family history  No family h/o breast cancer  No blood clot history No personal history of smoking   Stress level is a lot less thankfully    She does not have a gyn   She has done some research on bio identicals   Used to see Georgianne Fick -gyn   H/o cystic acne  Takes aldactone 25 mg daily   BP Readings from Last 3 Encounters:  09/01/20 136/82  11/13/19 122/78  11/11/18 124/82   Pulse Readings from Last 3 Encounters:  09/01/20 81  11/13/19 62  11/11/18 62    Taking aldactone 25 mg for cystic acne  Has helped tremendously  Even tried accutaine in the past   Lab Results  Component Value Date   CREATININE 0.86 11/13/2019   BUN 26 (H) 11/13/2019   NA 139 11/13/2019   K 4.3 11/13/2019   CL 104 11/13/2019   CO2 27 11/13/2019    Patient Active Problem List   Diagnosis Date Noted   Adverse drug effect 09/01/2020   Cystic acne 11/13/2019   Menopausal symptom 12/28/2016   Past Medical History:  Diagnosis Date   CHICKENPOX, HX  OF    Gestational hypertension    PUD (peptic ulcer disease)    VENEREAL WART    Past Surgical History:  Procedure Laterality Date   BACK SURGERY  02/2015   PELVIC TUMOR REMOVED  2006   Social History   Tobacco Use   Smoking status: Former   Smokeless tobacco: Never   Tobacco comments:    quit in 2001  Substance Use Topics   Alcohol use: Yes    Alcohol/week: 0.0 standard drinks    Comment: social   Drug use: No   Family History  Problem Relation Age of Onset   Alcohol abuse Mother    Arthritis Mother    Depression Mother    Melanoma Sister    Alcohol abuse Maternal Grandmother    Arthritis Maternal Grandmother    Heart disease Maternal Grandmother    Stroke Maternal Grandmother    Allergies  Allergen Reactions   Bactrim [Sulfamethoxazole-Trimethoprim] Swelling    Blisters, erythema to skin   Doxycycline Hives    Joint pains   Current Outpatient Medications on File Prior to Visit  Medication Sig Dispense Refill   CALCIUM GLUCONATE PO Take 500 mg by mouth daily.     Magnesium 500 MG CAPS Take 1 capsule by mouth at bedtime.  VITAMIN D PO Take 5,000 Units by mouth daily.     No current facility-administered medications on file prior to visit.     Review of Systems  Constitutional:  Positive for fatigue and unexpected weight change. Negative for activity change, appetite change and fever.  HENT:  Negative for congestion, ear pain, rhinorrhea, sinus pressure and sore throat.   Eyes:  Negative for pain, redness and visual disturbance.  Respiratory:  Negative for cough, shortness of breath and wheezing.   Cardiovascular:  Negative for chest pain and palpitations.  Gastrointestinal:  Negative for abdominal pain, blood in stool, constipation and diarrhea.  Endocrine: Positive for heat intolerance. Negative for polydipsia and polyuria.  Genitourinary:  Negative for dysuria, frequency and urgency.  Musculoskeletal:  Negative for arthralgias, back pain and myalgias.   Skin:  Negative for pallor and rash.       Acne is improved   Allergic/Immunologic: Negative for environmental allergies.  Neurological:  Negative for dizziness, syncope and headaches.  Hematological:  Negative for adenopathy. Does not bruise/bleed easily.  Psychiatric/Behavioral:  Positive for sleep disturbance. Negative for decreased concentration and dysphoric mood. The patient is not nervous/anxious.        Some mood fluctuation       Objective:   Physical Exam Constitutional:      General: She is not in acute distress.    Appearance: Normal appearance. She is well-developed and normal weight. She is not ill-appearing or diaphoretic.  HENT:     Head: Normocephalic and atraumatic.  Eyes:     Conjunctiva/sclera: Conjunctivae normal.     Pupils: Pupils are equal, round, and reactive to light.  Neck:     Thyroid: No thyromegaly.     Vascular: No carotid bruit or JVD.  Cardiovascular:     Rate and Rhythm: Normal rate and regular rhythm.     Heart sounds: Normal heart sounds.    No gallop.  Pulmonary:     Effort: Pulmonary effort is normal. No respiratory distress.     Breath sounds: Normal breath sounds. No wheezing or rales.  Abdominal:     General: Bowel sounds are normal. There is no abdominal bruit.     Palpations: Abdomen is soft.  Musculoskeletal:     Cervical back: Normal range of motion and neck supple.     Right lower leg: No edema.     Left lower leg: No edema.     Comments: No kyphosis   Lymphadenopathy:     Cervical: No cervical adenopathy.  Skin:    General: Skin is warm and dry.     Coloration: Skin is not pale.     Findings: No rash.     Comments: Few healed blemishes on chin /no comedones  Neurological:     Mental Status: She is alert.     Coordination: Coordination normal.     Deep Tendon Reflexes: Reflexes are normal and symmetric. Reflexes normal.  Psychiatric:        Mood and Affect: Mood normal.        Cognition and Memory: Cognition and memory  normal.          Assessment & Plan:   Problem List Items Addressed This Visit       Musculoskeletal and Integument   Cystic acne - Primary    Spironolactone has been very helpful  Records reviewed  Bmp today       Relevant Orders   Basic metabolic panel (Completed)     Other  Menopausal symptom    Struggling with hot flashes and night sweats and interrupted sleep  als dec libido/mood change and vaginal dryness  Disc pros /cons of different tx  paxil helps with some symptoms  Pt wishes to discuss with gyn- plans to f/u with Georgianne Fick Will continue paxil for now  Enc good self care and exercise       Adverse drug effect    Bmp for spinonolactone today      Relevant Orders   Basic metabolic panel (Completed)   Other Visit Diagnoses     Menopausal symptoms       Relevant Medications   PARoxetine (PAXIL) 10 MG tablet

## 2020-09-01 NOTE — Patient Instructions (Addendum)
Stay on paxil for now  I recommend follow up with your gyn to discuss options for treatment   Continue aldactone  Labs today

## 2020-09-01 NOTE — Assessment & Plan Note (Signed)
Spironolactone has been very helpful  Records reviewed  Bmp today

## 2020-09-01 NOTE — Assessment & Plan Note (Signed)
Struggling with hot flashes and night sweats and interrupted sleep  als dec libido/mood change and vaginal dryness  Disc pros /cons of different tx  paxil helps with some symptoms  Pt wishes to discuss with gyn- plans to f/u with Georgianne Fick Will continue paxil for now  Enc good self care and exercise

## 2020-11-16 ENCOUNTER — Encounter: Payer: 59 | Admitting: Internal Medicine

## 2021-03-30 ENCOUNTER — Other Ambulatory Visit: Payer: Self-pay | Admitting: Obstetrics & Gynecology

## 2021-03-30 ENCOUNTER — Other Ambulatory Visit: Payer: Self-pay | Admitting: Obstetrics and Gynecology

## 2021-03-30 DIAGNOSIS — Z1231 Encounter for screening mammogram for malignant neoplasm of breast: Secondary | ICD-10-CM

## 2021-04-06 ENCOUNTER — Ambulatory Visit
Admission: RE | Admit: 2021-04-06 | Discharge: 2021-04-06 | Disposition: A | Discharge status: Home / Self Care | Payer: No Typology Code available for payment source | Source: Ambulatory Visit | Attending: Obstetrics and Gynecology | Admitting: Obstetrics and Gynecology

## 2021-04-06 DIAGNOSIS — Z1231 Encounter for screening mammogram for malignant neoplasm of breast: Secondary | ICD-10-CM | POA: Diagnosis not present

## 2021-08-21 ENCOUNTER — Other Ambulatory Visit: Payer: Self-pay | Admitting: Family Medicine

## 2021-08-30 ENCOUNTER — Ambulatory Visit (INDEPENDENT_AMBULATORY_CARE_PROVIDER_SITE_OTHER): Payer: No Typology Code available for payment source | Admitting: Family Medicine

## 2021-08-30 ENCOUNTER — Encounter: Payer: Self-pay | Admitting: Family Medicine

## 2021-08-30 VITALS — BP 122/76 | HR 74 | Ht 63.39 in | Wt 136.2 lb

## 2021-08-30 DIAGNOSIS — Z0001 Encounter for general adult medical examination with abnormal findings: Secondary | ICD-10-CM | POA: Diagnosis not present

## 2021-08-30 DIAGNOSIS — R6889 Other general symptoms and signs: Secondary | ICD-10-CM | POA: Diagnosis not present

## 2021-08-30 DIAGNOSIS — Z23 Encounter for immunization: Secondary | ICD-10-CM

## 2021-08-30 DIAGNOSIS — Z8262 Family history of osteoporosis: Secondary | ICD-10-CM | POA: Diagnosis not present

## 2021-08-30 DIAGNOSIS — L700 Acne vulgaris: Secondary | ICD-10-CM

## 2021-08-30 DIAGNOSIS — Z0000 Encounter for general adult medical examination without abnormal findings: Secondary | ICD-10-CM

## 2021-08-30 LAB — CBC WITH DIFFERENTIAL/PLATELET
Basophils Absolute: 0 10*3/uL (ref 0.0–0.1)
Basophils Relative: 0.3 % (ref 0.0–3.0)
Eosinophils Absolute: 0.1 10*3/uL (ref 0.0–0.7)
Eosinophils Relative: 1 % (ref 0.0–5.0)
HCT: 40.1 % (ref 36.0–46.0)
Hemoglobin: 13.5 g/dL (ref 12.0–15.0)
Lymphocytes Relative: 10.9 % — ABNORMAL LOW (ref 12.0–46.0)
Lymphs Abs: 0.7 10*3/uL (ref 0.7–4.0)
MCHC: 33.7 g/dL (ref 30.0–36.0)
MCV: 95.7 fl (ref 78.0–100.0)
Monocytes Absolute: 0.2 10*3/uL (ref 0.1–1.0)
Monocytes Relative: 4 % (ref 3.0–12.0)
Neutro Abs: 5.2 10*3/uL (ref 1.4–7.7)
Neutrophils Relative %: 83.8 % — ABNORMAL HIGH (ref 43.0–77.0)
Platelets: 185 10*3/uL (ref 150.0–400.0)
RBC: 4.19 Mil/uL (ref 3.87–5.11)
RDW: 12.4 % (ref 11.5–15.5)
WBC: 6.2 10*3/uL (ref 4.0–10.5)

## 2021-08-30 LAB — COMPREHENSIVE METABOLIC PANEL
ALT: 13 U/L (ref 0–35)
AST: 16 U/L (ref 0–37)
Albumin: 5 g/dL (ref 3.5–5.2)
Alkaline Phosphatase: 45 U/L (ref 39–117)
BUN: 13 mg/dL (ref 6–23)
CO2: 26 mEq/L (ref 19–32)
Calcium: 9.3 mg/dL (ref 8.4–10.5)
Chloride: 105 mEq/L (ref 96–112)
Creatinine, Ser: 0.92 mg/dL (ref 0.40–1.20)
GFR: 71 mL/min (ref >60.00)
Glucose, Bld: 96 mg/dL (ref 70–99)
Potassium: 3.9 mEq/L (ref 3.5–5.1)
Sodium: 140 mEq/L (ref 135–145)
Total Bilirubin: 0.5 mg/dL (ref 0.2–1.2)
Total Protein: 7.3 g/dL (ref 6.0–8.3)

## 2021-08-30 LAB — LIPID PANEL
Cholesterol: 241 mg/dL — ABNORMAL HIGH (ref 0–200)
HDL: 80 mg/dL (ref >39.00)
LDL Cholesterol: 149 mg/dL — ABNORMAL HIGH (ref 0–99)
NonHDL: 160.68
Total CHOL/HDL Ratio: 3
Triglycerides: 56 mg/dL (ref 0.0–149.0)
VLDL: 11.2 mg/dL (ref 0.0–40.0)

## 2021-08-30 LAB — TSH: TSH: 2.13 u[IU]/mL (ref 0.35–5.50)

## 2021-08-30 NOTE — Assessment & Plan Note (Signed)
tsh ordered Pt wishes to return in the future for full thyroid panel

## 2021-08-30 NOTE — Patient Instructions (Addendum)
Get a lfu shot in the fall  If you are interested in the new shingles vaccine (Shingrix) - call your local pharmacy to check on coverage and availability  If affordable, get on a wait list at your pharmacy to get the vaccine.   Tetanus shot today   Find out if your insurance covers a dexa scan   Try and get 2000 iu of vitamin D3 daily   Labs today

## 2021-08-30 NOTE — Assessment & Plan Note (Signed)
Refilled spironolactone This continues to help  Labs ordered

## 2021-08-30 NOTE — Assessment & Plan Note (Signed)
Reviewed health habits including diet and exercise and skin cancer prevention Reviewed appropriate screening tests for age  Also reviewed health mt list, fam hx and immunization status , as well as social and family history   See HPI Labs ordered  phq 0 Mammogram utd 03/2021 Recommend regular dermatology fisits Tetanus shot updated  Pap utd  Plans to check coverage of shingrix and get if able  cologuard utd from 12/2019

## 2021-08-30 NOTE — Progress Notes (Signed)
Subjective:    Patient ID: Jill Gay, female    DOB: [birth year], 56 y.o.   MRN: [id number]  HPI Here for health maintenance exam and to review chronic medical problems    Wt Readings from Last 3 Encounters:  08/30/21 136 lb 3.2 oz (61.8 kg)  09/01/20 135 lb 7 oz (61.4 kg)  11/13/19 141 lb (64 kg)   23.83 kg/m  Moved her kids into college  This is hard on her   Feeling good overall   Immunization History  Administered Date(s) Administered   Influenza,inj,Quad PF,6+ Mos 10/08/2013, 10/21/2015, 11/06/2016, 11/08/2017, 11/11/2018, 11/13/2019   Influenza-Unspecified 10/03/2014   Tdap 11/16/2010   Health Maintenance Due  Topic Date Due   Fecal DNA (Cologuard)  Never done   Zoster Vaccines- Shingrix (1 of 2) Never done   PAP SMEAR-Modifier  11/07/2019   TETANUS/TDAP  11/15/2020   INFLUENZA VACCINE  08/09/2021   Colon cancer screening  Cologuard neg 12/2019  Shingrix:  interested if covered  She had shingles 13 y ago   Pap 10/2016 -pos HPV Has gyn in November and will have one there  Last one was here   Vivelle dot and progesterone for HRT (compounded) -not even a year  Only takes progesterone monthly  It helps her symptoms a lot  Was able to come off of the paxil   No more hot flashes Unsure how long she will be on it   Aldactone for cystic acne Works fairly well Still has some flare ups    Tdap 11/2010   Mammogram 03/2021 Self breast exam: no lumps   Fam h/o melanoma in sister      08/30/2021    8:20 AM 09/01/2020    8:46 AM 11/13/2019    8:08 AM 11/11/2018    8:20 AM 11/08/2017    8:18 AM  Depression screen PHQ 2/9  Decreased Interest 0 0 0 0 0  Down, Depressed, Hopeless 0 0 0 0 0  PHQ - 2 Score 0 0 0 0 0  Altered sleeping  0     Tired, decreased energy  0     Change in appetite  0     Feeling bad or failure about yourself   0     Trouble concentrating  0     Moving slowly or fidgety/restless  0     Suicidal thoughts  0     PHQ-9 Score  0      Difficult doing work/chores  Not difficult at all      Mood On paxil in the past   Due for labs today   Wants to get a full thyroid panel eventually  Is cold all the time and loosing hair   Wants a dexa scan  No h/o fragility fractures  Mother and sister have severe osteoporosis   Exercise - lifts heavy weights and does cardio   Takes vitamin D   Patient Active Problem List   Diagnosis Date Noted   Cold intolerance 08/30/2021   Family history of osteoporosis 08/30/2021   Adverse drug effect 09/01/2020   Cystic acne 11/13/2019   Menopausal symptom 12/28/2016   Routine general medical examination at a health care facility 10/09/2013   Past Medical History:  Diagnosis Date   CHICKENPOX, HX OF    Gestational hypertension    PUD (peptic ulcer disease)    VENEREAL WART    Past Surgical History:  Procedure Laterality Date   BACK SURGERY  02/2015  PELVIC TUMOR REMOVED  2006   Social History   Tobacco Use   Smoking status: Former   Smokeless tobacco: Never   Tobacco comments:    quit in 2001  Substance Use Topics   Alcohol use: Yes    Alcohol/week: 0.0 standard drinks of alcohol    Comment: social   Drug use: No   Family History  Problem Relation Age of Onset   Alcohol abuse Mother    Arthritis Mother    Depression Mother    Melanoma Sister    Alcohol abuse Maternal Grandmother    Arthritis Maternal Grandmother    Heart disease Maternal Grandmother    Stroke Maternal Grandmother    Allergies  Allergen Reactions   Bactrim [Sulfamethoxazole-Trimethoprim] Swelling    Blisters, erythema to skin   Doxycycline Hives    Joint pains   Current Outpatient Medications on File Prior to Visit  Medication Sig Dispense Refill   estradiol (VIVELLE-DOT) 0.025 MG/24HR APPLY 1 PATCH TWICE A WEEK TOPICALLY AS DIRECTED FOR 90 DAYS     Magnesium 500 MG CAPS Take 1 capsule by mouth at bedtime.     progesterone (PROMETRIUM) 100 MG capsule Take by mouth.      spironolactone (ALDACTONE) 25 MG tablet TAKE 1 TABLET (25 MG TOTAL) BY MOUTH DAILY. 90 tablet 3   CALCIUM GLUCONATE PO Take 500 mg by mouth daily. (Patient not taking: Reported on 08/30/2021)     PARoxetine (PAXIL) 10 MG tablet TAKE 1 TABLET BY MOUTH EVERYDAY AT BEDTIME (Patient not taking: Reported on 08/30/2021) 90 tablet 3   VITAMIN D PO Take 5,000 Units by mouth daily. (Patient not taking: Reported on 08/30/2021)     No current facility-administered medications on file prior to visit.     Review of Systems  Constitutional:  Negative for activity change, appetite change, fatigue, fever and unexpected weight change.  HENT:  Negative for congestion, ear pain, rhinorrhea, sinus pressure and sore throat.   Eyes:  Negative for pain, redness and visual disturbance.  Respiratory:  Negative for cough, shortness of breath and wheezing.   Cardiovascular:  Negative for chest pain and palpitations.  Gastrointestinal:  Negative for abdominal pain, blood in stool, constipation and diarrhea.  Endocrine: Positive for cold intolerance. Negative for polydipsia and polyuria.  Genitourinary:  Negative for dysuria, frequency and urgency.  Musculoskeletal:  Negative for arthralgias, back pain and myalgias.  Skin:  Negative for pallor and rash.  Allergic/Immunologic: Negative for environmental allergies.  Neurological:  Negative for dizziness, syncope and headaches.  Hematological:  Negative for adenopathy. Does not bruise/bleed easily.  Psychiatric/Behavioral:  Negative for decreased concentration and dysphoric mood. The patient is not nervous/anxious.        Objective:   Physical Exam Constitutional:      General: She is not in acute distress.    Appearance: Normal appearance. She is well-developed and normal weight. She is not ill-appearing or diaphoretic.  HENT:     Head: Normocephalic and atraumatic.     Right Ear: Tympanic membrane, ear canal and external ear normal.     Left Ear: Tympanic membrane,  ear canal and external ear normal.     Nose: Nose normal. No congestion.     Mouth/Throat:     Mouth: Mucous membranes are moist.     Pharynx: Oropharynx is clear. No posterior oropharyngeal erythema.  Eyes:     General: No scleral icterus.    Extraocular Movements: Extraocular movements intact.     Conjunctiva/sclera: Conjunctivae  normal.     Pupils: Pupils are equal, round, and reactive to light.  Neck:     Thyroid: No thyromegaly.     Vascular: No carotid bruit or JVD.  Cardiovascular:     Rate and Rhythm: Normal rate and regular rhythm.     Pulses: Normal pulses.     Heart sounds: Normal heart sounds.     No gallop.  Pulmonary:     Effort: Pulmonary effort is normal. No respiratory distress.     Breath sounds: Normal breath sounds. No wheezing.     Comments: Good air exch Chest:     Chest wall: No tenderness.  Abdominal:     General: Bowel sounds are normal. There is no distension or abdominal bruit.     Palpations: Abdomen is soft. There is no mass.     Tenderness: There is no abdominal tenderness.     Hernia: No hernia is present.  Genitourinary:    Comments: Breast exam: No mass, nodules, thickening, tenderness, bulging, retraction, inflamation, nipple discharge or skin changes noted.  No axillary or clavicular LA.     Musculoskeletal:        General: No tenderness. Normal range of motion.     Cervical back: Normal range of motion and neck supple. No rigidity. No muscular tenderness.     Right lower leg: No edema.     Left lower leg: No edema.     Comments: No kyphosis   Lymphadenopathy:     Cervical: No cervical adenopathy.  Skin:    General: Skin is warm and dry.     Coloration: Skin is not pale.     Findings: No erythema or rash.     Comments: Solar lentigines diffusely   Facial acne is fairly controlled   Neurological:     Mental Status: She is alert. Mental status is at baseline.     Cranial Nerves: No cranial nerve deficit.     Motor: No abnormal muscle  tone.     Coordination: Coordination normal.     Gait: Gait normal.     Deep Tendon Reflexes: Reflexes are normal and symmetric. Reflexes normal.  Psychiatric:        Mood and Affect: Mood normal.        Cognition and Memory: Cognition and memory normal.           Assessment & Plan:   Problem List Items Addressed This Visit       Musculoskeletal and Integument   Cystic acne    Refilled spironolactone This continues to help  Labs ordered         Other   Cold intolerance    tsh ordered Pt wishes to return in the future for full thyroid panel       Family history of osteoporosis    Pt is interested in dexa if ins covers Will call and find out and let us know   Recommend vit D3 2000 iu daily  Also exercise       Routine general medical examination at a health care facility - Primary    Reviewed health habits including diet and exercise and skin cancer prevention Reviewed appropriate screening tests for age  Also reviewed health mt list, fam hx and immunization status , as well as social and family history   See HPI Labs ordered  phq 0 Mammogram utd 03/2021 Recommend regular dermatology fisits Tetanus shot updated  Pap utd  Plans to check coverage of shingrix and  get if able  cologuard utd from 12/2019         Relevant Orders   TSH (Completed)   Lipid panel (Completed)   Comprehensive metabolic panel (Completed)   CBC with Differential/Platelet (Completed)

## 2021-08-30 NOTE — Assessment & Plan Note (Signed)
Pt is interested in dexa if ins covers Will call and find out and let us know   Recommend vit D3 2000 iu daily  Also exercise

## 2021-08-31 ENCOUNTER — Encounter: Payer: Self-pay | Admitting: Family Medicine

## 2021-11-02 ENCOUNTER — Telehealth: Payer: Self-pay | Admitting: Family Medicine

## 2021-11-02 DIAGNOSIS — E785 Hyperlipidemia, unspecified: Secondary | ICD-10-CM | POA: Insufficient documentation

## 2021-11-02 DIAGNOSIS — E7800 Pure hypercholesterolemia, unspecified: Secondary | ICD-10-CM

## 2021-11-02 DIAGNOSIS — R6889 Other general symptoms and signs: Secondary | ICD-10-CM

## 2021-11-02 NOTE — Telephone Encounter (Signed)
-----   Message from Ellamae Sia sent at 11/01/2021  3:11 PM EDT ----- Regarding: Lab orders for Thursday, 10.26.23 Lab orders, thanks

## 2021-11-03 ENCOUNTER — Other Ambulatory Visit (INDEPENDENT_AMBULATORY_CARE_PROVIDER_SITE_OTHER): Payer: No Typology Code available for payment source

## 2021-11-03 DIAGNOSIS — R6889 Other general symptoms and signs: Secondary | ICD-10-CM | POA: Diagnosis not present

## 2021-11-03 DIAGNOSIS — E7800 Pure hypercholesterolemia, unspecified: Secondary | ICD-10-CM | POA: Diagnosis not present

## 2021-11-03 LAB — LIPID PANEL
Cholesterol: 206 mg/dL — ABNORMAL HIGH (ref 0–200)
HDL: 71.2 mg/dL (ref >39.00)
LDL Cholesterol: 123 mg/dL — ABNORMAL HIGH (ref 0–99)
NonHDL: 134.48
Total CHOL/HDL Ratio: 3
Triglycerides: 57 mg/dL (ref 0.0–149.0)
VLDL: 11.4 mg/dL (ref 0.0–40.0)

## 2021-11-03 LAB — T3, FREE: T3, Free: 3.4 pg/mL (ref 2.3–4.2)

## 2021-11-03 LAB — T4, FREE: Free T4: 0.79 ng/dL (ref 0.60–1.60)

## 2021-11-03 LAB — TSH: TSH: 3.07 u[IU]/mL (ref 0.35–5.50)

## 2021-12-03 LAB — HM PAP SMEAR: HPV, high-risk: NEGATIVE

## 2022-08-22 ENCOUNTER — Other Ambulatory Visit: Payer: Self-pay | Admitting: Family Medicine

## 2022-08-22 NOTE — Telephone Encounter (Signed)
Spoke to pt, scheduled pt for 09/25/22

## 2022-08-22 NOTE — Telephone Encounter (Signed)
Pt is due for her CPE (labs prior if possible) on or after 09/01/22, please schedule and then route back to me to refill

## 2022-08-24 ENCOUNTER — Encounter (INDEPENDENT_AMBULATORY_CARE_PROVIDER_SITE_OTHER): Payer: Self-pay

## 2022-09-20 ENCOUNTER — Telehealth: Payer: Self-pay | Admitting: Family Medicine

## 2022-09-20 ENCOUNTER — Other Ambulatory Visit (INDEPENDENT_AMBULATORY_CARE_PROVIDER_SITE_OTHER): Payer: 59

## 2022-09-20 DIAGNOSIS — Z0000 Encounter for general adult medical examination without abnormal findings: Secondary | ICD-10-CM | POA: Diagnosis not present

## 2022-09-20 DIAGNOSIS — E7800 Pure hypercholesterolemia, unspecified: Secondary | ICD-10-CM

## 2022-09-20 LAB — COMPREHENSIVE METABOLIC PANEL
ALT: 24 U/L (ref 0–35)
AST: 23 U/L (ref 0–37)
Albumin: 4.6 g/dL (ref 3.5–5.2)
Alkaline Phosphatase: 43 U/L (ref 39–117)
BUN: 24 mg/dL — ABNORMAL HIGH (ref 6–23)
CO2: 28 meq/L (ref 19–32)
Calcium: 9.1 mg/dL (ref 8.4–10.5)
Chloride: 103 meq/L (ref 96–112)
Creatinine, Ser: 0.84 mg/dL (ref 0.40–1.20)
GFR: 78.6 mL/min (ref >60.00)
Glucose, Bld: 95 mg/dL (ref 70–99)
Potassium: 4.3 meq/L (ref 3.5–5.1)
Sodium: 140 meq/L (ref 135–145)
Total Bilirubin: 0.6 mg/dL (ref 0.2–1.2)
Total Protein: 7 g/dL (ref 6.0–8.3)

## 2022-09-20 LAB — CBC WITH DIFFERENTIAL/PLATELET
Basophils Absolute: 0 10*3/uL (ref 0.0–0.1)
Basophils Relative: 1 % (ref 0.0–3.0)
Eosinophils Absolute: 0.1 10*3/uL (ref 0.0–0.7)
Eosinophils Relative: 1.6 % (ref 0.0–5.0)
HCT: 41.7 % (ref 36.0–46.0)
Hemoglobin: 13.7 g/dL (ref 12.0–15.0)
Lymphocytes Relative: 20.9 % (ref 12.0–46.0)
Lymphs Abs: 1 10*3/uL (ref 0.7–4.0)
MCHC: 32.9 g/dL (ref 30.0–36.0)
MCV: 98.7 fl (ref 78.0–100.0)
Monocytes Absolute: 0.3 10*3/uL (ref 0.1–1.0)
Monocytes Relative: 5.5 % (ref 3.0–12.0)
Neutro Abs: 3.3 10*3/uL (ref 1.4–7.7)
Neutrophils Relative %: 71 % (ref 43.0–77.0)
Platelets: 194 10*3/uL (ref 150.0–400.0)
RBC: 4.23 Mil/uL (ref 3.87–5.11)
RDW: 12.6 % (ref 11.5–15.5)
WBC: 4.7 10*3/uL (ref 4.0–10.5)

## 2022-09-20 LAB — LIPID PANEL
Cholesterol: 203 mg/dL — ABNORMAL HIGH (ref 0–200)
HDL: 79.8 mg/dL (ref >39.00)
LDL Cholesterol: 116 mg/dL — ABNORMAL HIGH (ref 0–99)
NonHDL: 123.5
Total CHOL/HDL Ratio: 3
Triglycerides: 39 mg/dL (ref 0.0–149.0)
VLDL: 7.8 mg/dL (ref 0.0–40.0)

## 2022-09-20 LAB — TSH: TSH: 3.5 u[IU]/mL (ref 0.35–5.50)

## 2022-09-20 NOTE — Telephone Encounter (Signed)
Lab orders

## 2022-09-25 ENCOUNTER — Ambulatory Visit (INDEPENDENT_AMBULATORY_CARE_PROVIDER_SITE_OTHER): Payer: 59 | Admitting: Family Medicine

## 2022-09-25 ENCOUNTER — Encounter: Payer: Self-pay | Admitting: Family Medicine

## 2022-09-25 VITALS — BP 135/80 | HR 72 | Temp 97.8°F | Ht 63.5 in | Wt 138.4 lb

## 2022-09-25 DIAGNOSIS — L700 Acne vulgaris: Secondary | ICD-10-CM

## 2022-09-25 DIAGNOSIS — E7800 Pure hypercholesterolemia, unspecified: Secondary | ICD-10-CM

## 2022-09-25 DIAGNOSIS — E2839 Other primary ovarian failure: Secondary | ICD-10-CM | POA: Diagnosis not present

## 2022-09-25 DIAGNOSIS — Z0000 Encounter for general adult medical examination without abnormal findings: Secondary | ICD-10-CM

## 2022-09-25 DIAGNOSIS — Z8262 Family history of osteoporosis: Secondary | ICD-10-CM

## 2022-09-25 DIAGNOSIS — Z1211 Encounter for screening for malignant neoplasm of colon: Secondary | ICD-10-CM | POA: Insufficient documentation

## 2022-09-25 DIAGNOSIS — E559 Vitamin D deficiency, unspecified: Secondary | ICD-10-CM | POA: Insufficient documentation

## 2022-09-25 MED ORDER — SPIRONOLACTONE 25 MG PO TABS: 25.0000 mg | ORAL_TABLET | Freq: Every day | ORAL | 3 refills | Status: DC

## 2022-09-25 NOTE — Assessment & Plan Note (Signed)
Continues spironolactone Has worked well  Accutane in distant past   Labs reviewed

## 2022-09-25 NOTE — Assessment & Plan Note (Signed)
Pt wants to check her bone density  She has also had personal spinal fracture in the past  Unsure if ins will pay/willing to pay out of pocket  Order done for dexa Pt will call to schedule

## 2022-09-25 NOTE — Assessment & Plan Note (Signed)
Dexa ordered  Pt has family history of OSTEOPOROSIS Personal history of spinal fracture in past

## 2022-09-25 NOTE — Assessment & Plan Note (Signed)
Did cologuard 12/2019  Ready to do colonoscopy for screening  Referral done to GI

## 2022-09-25 NOTE — Assessment & Plan Note (Signed)
Disc goals for lipids and reasons to control them Rev last labs with pt Rev low sat fat diet in detail some improvement in LDL - at 116 High HDL

## 2022-09-25 NOTE — Assessment & Plan Note (Signed)
Will check vit D level next time Good about this and ca intake   Discussed importance to bone and overall health

## 2022-09-25 NOTE — Progress Notes (Signed)
Subjective:    Patient ID: Jill Gay, female    DOB: 05-27-1965, 57 y.o.   MRN: [id number]  HPI  Here for health maintenance exam and to review chronic medical problems   Wt Readings from Last 3 Encounters:  09/25/22 138 lb 6 oz (62.8 kg)  08/30/21 136 lb 3.2 oz (61.8 kg)  09/01/20 135 lb 7 oz (61.4 kg)   24.13 kg/m  Vitals:   09/25/22 0955 09/25/22 1030  BP: (!) 150/84 135/80  Pulse: 72   Temp: 97.8 F (36.6 C)   SpO2: 97%     Immunization History  Administered Date(s) Administered   Influenza,inj,Quad PF,6+ Mos 10/08/2013, 10/21/2015, 11/06/2016, 11/08/2017, 11/11/2018, 11/13/2019   Influenza-Unspecified 10/03/2014   Tdap 11/16/2010, 08/30/2021    Health Maintenance Due  Topic Date Due   Cervical Cancer Screening (HPV/Pap Cotest)  11/07/2019   MAMMOGRAM  04/07/2022  Doing ok  Taking care of herself   Flu shot-declines  Shingrix - declines    Mammogram 03/2021 -has one in 2 months/ at obgyn in November  Self breast exam   Gyn health Sent for pap report from gyn  (last nov )  Takes HRT for menopausal symptoms    Colon cancer screening -cologuard 12/2019  Will be due in January /late dec   Bone health  Has family history of OSTEOPOROSIS  Falls- none Fractures-none  Did have vert fracture in 2016 and had to have surgery  Supplements  - ca and D   Exercise: lots   Dermatology care  : lifts heavy/strength training and loves it   Is on HRT     Mood    09/25/2022    9:58 AM 08/30/2021    8:20 AM 09/01/2020    8:46 AM 11/13/2019    8:08 AM 11/11/2018    8:20 AM  Depression screen PHQ 2/9  Decreased Interest 0 0 0 0 0  Down, Depressed, Hopeless 0 0 0 0 0  PHQ - 2 Score 0 0 0 0 0  Altered sleeping 0  0    Tired, decreased energy 0  0    Change in appetite 0  0    Feeling bad or failure about yourself  0  0    Trouble concentrating 0  0    Moving slowly or fidgety/restless 0  0    Suicidal thoughts 0  0    PHQ-9 Score 0  0    Difficult  doing work/chores Not difficult at all  Not difficult at all     Cystic acne Takes spironolactone  Really helps     Lab Results  Component Value Date   NA 140 09/20/2022   K 4.3 09/20/2022   CO2 28 09/20/2022   GLUCOSE 95 09/20/2022   BUN 24 (H) 09/20/2022   CREATININE 0.84 09/20/2022   CALCIUM 9.1 09/20/2022   GFR 78.60 09/20/2022   GFRNONAA 84.20 07/15/2008   Lab Results  Component Value Date   ALT 24 09/20/2022   AST 23 09/20/2022   ALKPHOS 43 09/20/2022   BILITOT 0.6 09/20/2022     Hyperlipidemia Lab Results  Component Value Date   CHOL 203 (H) 09/20/2022   CHOL 206 (H) 11/03/2021   CHOL 241 (H) 08/30/2021   Lab Results  Component Value Date   HDL 79.80 09/20/2022   HDL 71.20 11/03/2021   HDL 80.00 08/30/2021   Lab Results  Component Value Date   LDLCALC 116 (H) 09/20/2022   LDLCALC 123 (H) 11/03/2021  LDLCALC 149 (H) 08/30/2021   Lab Results  Component Value Date   TRIG 39.0 09/20/2022   TRIG 57.0 11/03/2021   TRIG 56.0 08/30/2021   Lab Results  Component Value Date   CHOLHDL 3 09/20/2022   CHOLHDL 3 11/03/2021   CHOLHDL 3 08/30/2021   No results found for: "LDLDIRECT"  LDL is improved  Takes fish oil  Eating more fiber   No fried food Occational red meat    History of low D in past  Last vitamin D Lab Results  Component Value Date   VD25OH 62.94 02/05/2020   Lab Results  Component Value Date   WBC 4.7 09/20/2022   HGB 13.7 09/20/2022   HCT 41.7 09/20/2022   MCV 98.7 09/20/2022   PLT 194.0 09/20/2022   Lab Results  Component Value Date   TSH 3.50 09/20/2022      Patient Active Problem List   Diagnosis Date Noted   Estrogen deficiency 09/25/2022   Vitamin D deficiency 09/25/2022   Colon cancer screening 09/25/2022   Hyperlipidemia 11/02/2021   Cold intolerance 08/30/2021   Family history of osteoporosis 08/30/2021   Adverse drug effect 09/01/2020   Cystic acne 11/13/2019   Menopausal symptom 12/28/2016    Routine general medical examination at a health care facility 10/09/2013   Past Medical History:  Diagnosis Date   CHICKENPOX, HX OF    Gestational hypertension    PUD (peptic ulcer disease)    VENEREAL WART    Past Surgical History:  Procedure Laterality Date   BACK SURGERY  02/2015   PELVIC TUMOR REMOVED  2006   Social History   Tobacco Use   Smoking status: Former   Smokeless tobacco: Never   Tobacco comments:    quit in 2001  Substance Use Topics   Alcohol use: Yes    Alcohol/week: 0.0 standard drinks of alcohol    Comment: social   Drug use: No   Family History  Problem Relation Age of Onset   Alcohol abuse Mother    Arthritis Mother    Depression Mother    Melanoma Sister    Alcohol abuse Maternal Grandmother    Arthritis Maternal Grandmother    Heart disease Maternal Grandmother    Stroke Maternal Grandmother    Allergies  Allergen Reactions   Bactrim [Sulfamethoxazole-Trimethoprim] Swelling    Blisters, erythema to skin   Doxycycline Hives    Joint pains   Current Outpatient Medications on File Prior to Visit  Medication Sig Dispense Refill   ascorbic acid (VITAMIN C) 500 MG tablet Take 500 mg by mouth daily.     CALCIUM GLUCONATE PO Take 500 mg by mouth daily.     estradiol (VIVELLE-DOT) 0.025 MG/24HR APPLY 1 PATCH TWICE A WEEK TOPICALLY AS DIRECTED FOR 90 DAYS     Magnesium 500 MG CAPS Take 1 capsule by mouth at bedtime.     Omega-3 Fatty Acids (FISH OIL) 875 MG CAPS Take 1 capsule by mouth daily.     progesterone (PROMETRIUM) 100 MG capsule Take by mouth.     VITAMIN D PO Take 5,000 Units by mouth daily.     No current facility-administered medications on file prior to visit.    Review of Systems  Constitutional:  Negative for activity change, appetite change, fatigue, fever and unexpected weight change.  HENT:  Negative for congestion, ear pain, rhinorrhea, sinus pressure and sore throat.   Eyes:  Negative for pain, redness and visual  disturbance.  Respiratory:  Negative for cough, shortness of breath and wheezing.   Cardiovascular:  Negative for chest pain and palpitations.  Gastrointestinal:  Negative for abdominal pain, blood in stool, constipation and diarrhea.  Endocrine: Negative for polydipsia and polyuria.  Genitourinary:  Negative for dysuria, frequency and urgency.  Musculoskeletal:  Negative for arthralgias, back pain and myalgias.  Skin:  Negative for pallor and rash.  Allergic/Immunologic: Negative for environmental allergies.  Neurological:  Negative for dizziness, syncope and headaches.  Hematological:  Negative for adenopathy. Does not bruise/bleed easily.  Psychiatric/Behavioral:  Negative for decreased concentration and dysphoric mood. The patient is not nervous/anxious.        Objective:   Physical Exam Constitutional:      General: She is not in acute distress.    Appearance: Normal appearance. She is well-developed and normal weight. She is not ill-appearing or diaphoretic.  HENT:     Head: Normocephalic and atraumatic.     Right Ear: Tympanic membrane, ear canal and external ear normal.     Left Ear: Tympanic membrane, ear canal and external ear normal.     Nose: Nose normal. No congestion.     Mouth/Throat:     Mouth: Mucous membranes are moist.     Pharynx: Oropharynx is clear. No posterior oropharyngeal erythema.  Eyes:     General: No scleral icterus.    Extraocular Movements: Extraocular movements intact.     Conjunctiva/sclera: Conjunctivae normal.     Pupils: Pupils are equal, round, and reactive to light.  Neck:     Thyroid: No thyromegaly.     Vascular: No carotid bruit or JVD.  Cardiovascular:     Rate and Rhythm: Normal rate and regular rhythm.     Pulses: Normal pulses.     Heart sounds: Normal heart sounds.     No gallop.  Pulmonary:     Effort: Pulmonary effort is normal. No respiratory distress.     Breath sounds: Normal breath sounds. No wheezing.     Comments: Good  air exch Chest:     Chest wall: No tenderness.  Abdominal:     General: Bowel sounds are normal. There is no distension or abdominal bruit.     Palpations: Abdomen is soft. There is no mass.     Tenderness: There is no abdominal tenderness.     Hernia: No hernia is present.  Genitourinary:    Comments: Breast and pelvic exam are done by gyn provider   Musculoskeletal:        General: No tenderness. Normal range of motion.     Cervical back: Normal range of motion and neck supple. No rigidity. No muscular tenderness.     Right lower leg: No edema.     Left lower leg: No edema.     Comments: No kyphosis   Lymphadenopathy:     Cervical: No cervical adenopathy.  Skin:    General: Skin is warm and dry.     Coloration: Skin is not pale.     Findings: No erythema or rash.     Comments: Solar lentigines diffusely   Neurological:     Mental Status: She is alert. Mental status is at baseline.     Cranial Nerves: No cranial nerve deficit.     Motor: No abnormal muscle tone.     Coordination: Coordination normal.     Gait: Gait normal.     Deep Tendon Reflexes: Reflexes are normal and symmetric. Reflexes normal.  Psychiatric:  Mood and Affect: Mood normal.        Cognition and Memory: Cognition and memory normal.           Assessment & Plan:   Problem List Items Addressed This Visit       Musculoskeletal and Integument   Cystic acne    Continues spironolactone Has worked well  Accutane in distant past   Labs reviewed         Other   Colon cancer screening    Did cologuard 12/2019  Ready to do colonoscopy for screening  Referral done to GI      Relevant Orders   Ambulatory referral to Gastroenterology   Estrogen deficiency    Dexa ordered  Pt has family history of OSTEOPOROSIS Personal history of spinal fracture in past      Relevant Orders   DG Bone Density   Family history of osteoporosis    Pt wants to check her bone density  She has also had  personal spinal fracture in the past  Unsure if ins will pay/willing to pay out of pocket  Order done for dexa Pt will call to schedule       Hyperlipidemia    Disc goals for lipids and reasons to control them Rev last labs with pt Rev low sat fat diet in detail some improvement in LDL - at 116 High HDL       Relevant Medications   spironolactone (ALDACTONE) 25 MG tablet   Routine general medical examination at a health care facility - Primary    Reviewed health habits including diet and exercise and skin cancer prevention Reviewed appropriate screening tests for age  Also reviewed health mt list, fam hx and immunization status , as well as social and family history   See HPI Labs reviewed and ordered Declines flu and shingrix imms  Mammogram planned nov at [facility] for last pap report from gyn (on HRT) Ref for screening colonoscopy-pt will call to schedule  Ref for dexa (fam history)=pt will call to schedule Discussed fall prevention, supplements and exercise for bone density   Derm care is utd  PHQ 0       Vitamin D deficiency    Will check vit D level next time Good about this and ca intake   Discussed importance to bone and overall health

## 2022-09-25 NOTE — Patient Instructions (Addendum)
Follow up with gyn in November for exam and mammogram   Keep an eye on your blood pressure (at home when relaxed)     You are due for colon cancer screening (cologuard was 3 years ago January)  Call [facility] to set up a colonoscopy  [facility]  [phone]    You have an order for:  []   2D Mammogram  []   3D Mammogram  [x]   Bone Density     Please call for appointment:   [x]   [facility]  [address] [place] [address]  [phone]  []   [facility])   [address]  [place], [place] [address]  [phone]  []   [facility]      [address] [place], [place]        [phone]         []   [facility]  [address] [place], [place]  [phone]  []  [facility]   [address]   [place], [place] [address]  [phone]  []  [facility]  [address] [place], [place] [address] [phone]    Make sure to wear two piece clothing  No lotions powders or deodorants the day of the appointment Make sure to bring picture ID and insurance card.  Bring list of medications you are currently taking including any supplements.   Schedule your screening mammogram through MyChart!   Select [place] imaging sites can now be scheduled through MyChart.  Log into your MyChart account.  Go to 'Visit' (or 'Appointments' if  on mobile App) --> Schedule an  Appointment  Under 'Select a Reason for Visit' choose the Mammogram  Screening option.  Complete the pre-visit questions  and select the time and place that  best fits your schedule

## 2022-09-25 NOTE — Assessment & Plan Note (Addendum)
Reviewed health habits including diet and exercise and skin cancer prevention Reviewed appropriate screening tests for age  Also reviewed health mt list, fam hx and immunization status , as well as social and family history   See HPI Labs reviewed and ordered Declines flu and shingrix imms  Mammogram planned nov at [facility] for last pap report from gyn (on HRT) Ref for screening colonoscopy-pt will call to schedule  Ref for dexa (fam history)=pt will call to schedule Discussed fall prevention, supplements and exercise for bone density   Derm care is utd  PHQ 0

## 2022-09-27 ENCOUNTER — Encounter: Payer: Self-pay | Admitting: Gastroenterology

## 2022-11-27 LAB — HM MAMMOGRAPHY

## 2022-12-18 ENCOUNTER — Encounter: Payer: Self-pay | Admitting: *Deleted

## 2022-12-18 ENCOUNTER — Ambulatory Visit (AMBULATORY_SURGERY_CENTER): Payer: 59 | Admitting: *Deleted

## 2022-12-18 ENCOUNTER — Other Ambulatory Visit: Payer: Self-pay | Admitting: *Deleted

## 2022-12-18 VITALS — Ht 63.0 in | Wt 141.0 lb

## 2022-12-18 DIAGNOSIS — N939 Abnormal uterine and vaginal bleeding, unspecified: Secondary | ICD-10-CM | POA: Insufficient documentation

## 2022-12-18 DIAGNOSIS — Z1211 Encounter for screening for malignant neoplasm of colon: Secondary | ICD-10-CM

## 2022-12-18 MED ORDER — NA SULFATE-K SULFATE-MG SULF 17.5-3.13-1.6 GM/177ML PO SOLN
1.0000 | Freq: Once | ORAL | 0 refills | Status: AC
Start: 2022-12-18 — End: 2022-12-18

## 2022-12-18 NOTE — Progress Notes (Signed)
Patient showed up for virtual pv.  Conducted over telephone.  Instructions forwarded through MyChart.   No egg or soy allergy known to patient  No issues known to pt with past sedation with any surgeries or procedures Patient denies ever being told they had issues or difficulty with intubation  No FH of Malignant Hyperthermia Pt is not on diet pills Pt is not on  home 02  Pt is not on blood thinners  Pt denies issues with constipation  No A fib or A flutter Have any cardiac testing pending--NO Pt instructed to use [email or web address] or GoodRx for a price reduction on prep

## 2022-12-26 ENCOUNTER — Telehealth (HOSPITAL_BASED_OUTPATIENT_CLINIC_OR_DEPARTMENT_OTHER): Payer: Self-pay | Admitting: Family Medicine

## 2023-01-05 ENCOUNTER — Telehealth: Payer: Self-pay | Admitting: Gastroenterology

## 2023-01-05 NOTE — Telephone Encounter (Signed)
Resent instructions on MyChart and left patient a VM that this has been done.

## 2023-01-05 NOTE — Telephone Encounter (Signed)
Patient called and stated that she accidentally deleted her instruction on her my chart and if there was anyway that she could get them sent to her mychart again. Please advise.

## 2023-01-12 ENCOUNTER — Encounter: Payer: Self-pay | Admitting: Gastroenterology

## 2023-01-15 ENCOUNTER — Encounter: Payer: Self-pay | Admitting: Gastroenterology

## 2023-01-15 ENCOUNTER — Ambulatory Visit (AMBULATORY_SURGERY_CENTER): Payer: 59 | Admitting: Gastroenterology

## 2023-01-15 VITALS — BP 112/61 | HR 61 | Temp 97.5°F | Resp 12 | Ht 63.5 in | Wt 141.0 lb

## 2023-01-15 DIAGNOSIS — Z1211 Encounter for screening for malignant neoplasm of colon: Secondary | ICD-10-CM

## 2023-01-15 DIAGNOSIS — Q438 Other specified congenital malformations of intestine: Secondary | ICD-10-CM

## 2023-01-15 DIAGNOSIS — K648 Other hemorrhoids: Secondary | ICD-10-CM | POA: Diagnosis not present

## 2023-01-15 MED ORDER — SODIUM CHLORIDE 0.9 % IV SOLN: 500.0000 mL | Freq: Once | INTRAVENOUS | Status: AC

## 2023-01-15 NOTE — Progress Notes (Signed)
 History and Physical:  This patient presents for endoscopic testing for: Encounter Diagnosis  Name Primary?   Special screening for malignant neoplasms, colon Yes    Average risk for colorectal cancer.  First screening exam.  Patient denies chronic abdominal pain, rectal bleeding, constipation or diarrhea.    Patient is otherwise without complaints or active issues today.   Past Medical History: Past Medical History:  Diagnosis Date   CHICKENPOX, HX OF    Gestational hypertension    PUD (peptic ulcer disease)    VENEREAL WART      Past Surgical History: Past Surgical History:  Procedure Laterality Date   BACK SURGERY  02/2015   PELVIC TUMOR REMOVED  2006    Allergies: Allergies  Allergen Reactions   Bactrim  [Sulfamethoxazole -Trimethoprim ] Swelling    Blisters, erythema to skin   Doxycycline  Hives    Joint pains    Outpatient Meds: Current Outpatient Medications  Medication Sig Dispense Refill   ascorbic acid (VITAMIN C) 500 MG tablet Take 500 mg by mouth daily.     estradiol (VIVELLE-DOT) 0.025 MG/24HR APPLY 1 PATCH TWICE A WEEK TOPICALLY AS DIRECTED FOR 90 DAYS     Magnesium 250 MG CAPS Take by mouth.     Magnesium 500 MG CAPS Take 1 capsule by mouth at bedtime.     Omega-3 Fatty Acids (FISH OIL) 875 MG CAPS Take 1 capsule by mouth daily.     spironolactone  (ALDACTONE ) 25 MG tablet Take 1 tablet (25 mg total) by mouth daily. 90 tablet 3   VITAMIN D  PO Take 5,000 Units by mouth daily.     CALCIUM GLUCONATE PO Take 500 mg by mouth daily. (Patient not taking: Reported on 01/15/2023)     progesterone (PROMETRIUM) 100 MG capsule Take by mouth.     Current Facility-Administered Medications  Medication Dose Route Frequency Provider Last Rate Last Admin   0.9 %  sodium chloride  infusion  500 mL Intravenous Once Danis, Victory CROME III, MD          ___________________________________________________________________ Objective   Exam:  BP 127/68   Pulse 72   Temp  (!) 97.5 F (36.4 C) (Temporal)   Ht 5' 3.5 (1.613 m)   Wt 141 lb (64 kg)   LMP 12/31/2014   SpO2 99%   BMI 24.59 kg/m   CV: regular , S1/S2 Resp: clear to auscultation bilaterally, normal RR and effort noted GI: soft, no tenderness, with active bowel sounds.   Assessment: Encounter Diagnosis  Name Primary?   Special screening for malignant neoplasms, colon Yes     Plan: Colonoscopy   The benefits and risks of the planned procedure were described in detail with the patient or (when appropriate) their health care proxy.  Risks were outlined as including, but not limited to, bleeding, infection, perforation, adverse medication reaction leading to cardiac or pulmonary decompensation, pancreatitis (if ERCP).  The limitation of incomplete mucosal visualization was also discussed.  No guarantees or warranties were given.  The patient is appropriate for an endoscopic procedure in the ambulatory setting.   - Victory Brand, MD

## 2023-01-15 NOTE — Patient Instructions (Addendum)
 Return to normal activities tomorrow. Written discharge instructions were provided to the patient. - Resume previous diet. - Continue present medications. - Repeat colonoscopy in 10 years for screening purposes. ( More water with prep for next exam - redundant anatomy)  See Hemorrhoids handout provided by discharge nurse.  YOU HAD AN ENDOSCOPIC PROCEDURE TODAY AT THE [facility]:   Refer to the procedure report that was given to you for any specific questions about what was found during the examination.  If the procedure report does not answer your questions, please call your gastroenterologist to clarify.  If you requested that your care partner not be given the details of your procedure findings, then the procedure report has been included in a sealed envelope for you to review at your convenience later.  YOU SHOULD EXPECT: Some feelings of bloating in the abdomen. Passage of more gas than usual.  Walking can help get rid of the air that was put into your GI tract during the procedure and reduce the bloating. If you had a lower endoscopy (such as a colonoscopy or flexible sigmoidoscopy) you may notice spotting of blood in your stool or on the toilet paper. If you underwent a bowel prep for your procedure, you may not have a normal bowel movement for a few days.  Please Note:  You might notice some irritation and congestion in your nose or some drainage.  This is from the oxygen used during your procedure.  There is no need for concern and it should clear up in a day or so.  SYMPTOMS TO REPORT IMMEDIATELY:  Following lower endoscopy (colonoscopy or flexible sigmoidoscopy):  Excessive amounts of blood in the stool  Significant tenderness or worsening of abdominal pains  Swelling of the abdomen that is new, acute  Fever of 100F or higher  For urgent or emergent issues, a gastroenterologist can be reached at any hour by calling [phone]. Do not use MyChart messaging for urgent  concerns.    DIET:  We do recommend a small meal at first, but then you may proceed to your regular diet.  Drink plenty of fluids but you should avoid alcoholic beverages for 24 hours.  ACTIVITY:  You should plan to take it easy for the rest of today and you should NOT DRIVE or use heavy machinery until tomorrow (because of the sedation medicines used during the test).    FOLLOW UP: Our staff will call the number listed on your records the next business day following your procedure.  We will call around 7:15- 8:00 am to check on you and address any questions or concerns that you may have regarding the information given to you following your procedure. If we do not reach you, we will leave a message.     If any biopsies were taken you will be contacted by phone or by letter within the next 1-3 weeks.  Please call us  at [phone] if you have not heard about the biopsies in 3 weeks.    SIGNATURES/CONFIDENTIALITY: You and/or your care partner have signed paperwork which will be entered into your electronic medical record.  These signatures attest to the fact that that the information above on your After Visit Summary has been reviewed and is understood.  Full responsibility of the confidentiality of this discharge information lies with you and/or your care-partner.

## 2023-01-15 NOTE — Progress Notes (Signed)
 Pt's states no medical or surgical changes since previsit or office visit.

## 2023-01-15 NOTE — Op Note (Signed)
 [facility] Patient Name: Jill Gay Procedure Date: 01/15/2023 9:58 AM MRN: [id number] Endoscopist: Victory L. Legrand , MD, [id number] Age: 58 Referring MD:  Date of Birth: December 16, 1965 Gender: Female Account #: [id number] Procedure:                Colonoscopy Indications:              Screening for colorectal malignant neoplasm, This                            is the patient's first colonoscopy Medicines:                Monitored Anesthesia Care Procedure:                Pre-Anesthesia Assessment:                           - Prior to the procedure, a History and Physical                            was performed, and patient medications and                            allergies were reviewed. The patient's tolerance of                            previous anesthesia was also reviewed. The risks                            and benefits of the procedure and the sedation                            options and risks were discussed with the patient.                            All questions were answered, and informed consent                            was obtained. Prior Anticoagulants: The patient has                            taken no anticoagulant or antiplatelet agents. ASA                            Grade Assessment: II - A patient with mild systemic                            disease. After reviewing the risks and benefits,                            the patient was deemed in satisfactory condition to                            undergo the procedure.  After obtaining informed consent, the colonoscope                            was passed under direct vision. Throughout the                            procedure, the patient's blood pressure, pulse, and                            oxygen saturations were monitored continuously. The                            Colonoscope was introduced through the anus and                            advanced to the the  cecum, identified by                            appendiceal orifice and ileocecal valve. The                            colonoscopy was performed with difficulty due to a                            redundant colon and significant looping. Successful                            completion of the procedure was aided by using                            manual pressure and straightening and shortening                            the scope to obtain bowel loop reduction. The                            patient tolerated the procedure well. The quality                            of the bowel preparation was good with lavage of                            right colon. The ileocecal valve, appendiceal                            orifice, and rectum were photographed. The bowel                            preparation used was SUPREP via split dose                            instruction. Scope In: 10:25:00 AM Scope Out: 10:45:19 AM Scope Withdrawal Time: 0 hours 10 minutes 42 seconds  Total Procedure Duration:  0 hours 20 minutes 19 seconds  Findings:                 The perianal and digital rectal examinations were                            normal.                           Repeat examination of right colon under NBI                            performed.                           Internal hemorrhoids were found. The hemorrhoids                            were small.                           The exam was otherwise without abnormality on                            direct and retroflexion views. Complications:            No immediate complications. Estimated Blood Loss:     Estimated blood loss: none. Impression:               - Internal hemorrhoids.                           - The examination was otherwise normal on direct                            and retroflexion views.                           - No specimens collected. Recommendation:           - Patient has a contact number available for                             emergencies. The signs and symptoms of potential                            delayed complications were discussed with the                            patient. Return to normal activities tomorrow.                            Written discharge instructions were provided to the                            patient.                           - Resume previous diet.                           -  Continue present medications.                           - Repeat colonoscopy in 10 years for screening                            purposes. (More water with prep for next exam -                            redundant anatomy) Senaida Chilcote L. Legrand, MD 01/15/2023 10:49:01 AM This report has been signed electronically.

## 2023-01-15 NOTE — Progress Notes (Signed)
 To pacu, VSS. Report to RN.tb

## 2023-01-16 ENCOUNTER — Telehealth: Payer: Self-pay

## 2023-01-16 NOTE — Telephone Encounter (Signed)
  Follow up Call-     01/15/2023   10:06 AM  Call back number  Post procedure Call Back phone  # [phone]  Permission to leave phone message Yes     Patient questions:  Do you have a fever, pain , or abdominal swelling? No. Pain Score  0 *  Have you tolerated food without any problems? Yes.    Have you been able to return to your normal activities? Yes.    Do you have any questions about your discharge instructions: Diet   No. Medications  No. Follow up visit  No.  Do you have questions or concerns about your Care? No.  Actions: * If pain score is 4 or above: No action needed, pain <4.

## 2023-01-25 ENCOUNTER — Encounter: Payer: Self-pay | Admitting: Internal Medicine

## 2023-01-25 ENCOUNTER — Ambulatory Visit: Payer: 59 | Admitting: Internal Medicine

## 2023-01-25 ENCOUNTER — Ambulatory Visit: Payer: Self-pay | Admitting: Family Medicine

## 2023-01-25 VITALS — BP 130/86 | HR 70 | Temp 98.4°F | Ht 63.5 in | Wt 144.0 lb

## 2023-01-25 DIAGNOSIS — J0110 Acute frontal sinusitis, unspecified: Secondary | ICD-10-CM | POA: Insufficient documentation

## 2023-01-25 MED ORDER — AMOXICILLIN-POT CLAVULANATE 875-125 MG PO TABS: 1.0000 | ORAL_TABLET | Freq: Two times a day (BID) | ORAL | 0 refills | Status: DC

## 2023-01-25 NOTE — Telephone Encounter (Signed)
Appt scheduled today with Dr. Silvio Pate

## 2023-01-25 NOTE — Progress Notes (Signed)
Subjective:    Patient ID: Jill Gay, female    DOB: [birth year], 58 y.o.   MRN: [id number]  HPI Here due to respiratory infection  Woke with sore throat 4 days ago (now on day 5) That got better but still sick Lots of head pressure--behind eyes and occiput Post nasal drip and cough--and runny nose No fever Some chills/sweats this morning---got dizzy trying to work out No SOB No ear pain  Dayquil--helped until today  Current Outpatient Medications on File Prior to Visit  Medication Sig Dispense Refill   ascorbic acid (VITAMIN C) 500 MG tablet Take 500 mg by mouth daily.     CALCIUM GLUCONATE PO Take 500 mg by mouth daily.     estradiol (VIVELLE-DOT) 0.025 MG/24HR APPLY 1 PATCH TWICE A WEEK TOPICALLY AS DIRECTED FOR 90 DAYS     Magnesium 250 MG CAPS Take by mouth.     Magnesium 500 MG CAPS Take 1 capsule by mouth at bedtime.     Omega-3 Fatty Acids (FISH OIL) 875 MG CAPS Take 1 capsule by mouth daily.     progesterone (PROMETRIUM) 100 MG capsule Take by mouth.     spironolactone (ALDACTONE) 25 MG tablet Take 1 tablet (25 mg total) by mouth daily. 90 tablet 3   VITAMIN D PO Take 5,000 Units by mouth daily.     Current Facility-Administered Medications on File Prior to Visit  Medication Dose Route Frequency Provider Last Rate Last Admin   0.9 %  sodium chloride infusion  500 mL Intravenous Once Danis, Starr Lake III, MD        Allergies  Allergen Reactions   Bactrim [Sulfamethoxazole-Trimethoprim] Swelling    Blisters, erythema to skin   Doxycycline Hives    Joint pains    Past Medical History:  Diagnosis Date   CHICKENPOX, HX OF    Gestational hypertension    PUD (peptic ulcer disease)    VENEREAL WART     Past Surgical History:  Procedure Laterality Date   BACK SURGERY  02/2015   PELVIC TUMOR REMOVED  2006    Family History  Problem Relation Age of Onset   Alcohol abuse Mother    Arthritis Mother    Depression Mother    Melanoma Sister    Alcohol abuse  Maternal Grandmother    Arthritis Maternal Grandmother    Heart disease Maternal Grandmother    Stroke Maternal Grandmother    Colon cancer Neg Hx    Colon polyps Neg Hx     Social History   Socioeconomic History   Marital status: Married    Spouse name: Not on file   Number of children: 2   Years of education: Not on file   Highest education level: Not on file  Occupational History   Not on file  Tobacco Use   Smoking status: Former   Smokeless tobacco: Never   Tobacco comments:    quit in 2001  Vaping Use   Vaping status: Never Used  Substance and Sexual Activity   Alcohol use: Not Currently   Drug use: No   Sexual activity: Yes  Other Topics Concern   Not on file  Social History Narrative   Not on file   Social Drivers of Health   Financial Resource Strain: Not on file  Food Insecurity: Not on file  Transportation Needs: Not on file  Physical Activity: Not on file  Stress: Not on file  Social Connections: Not on file  Intimate Partner Violence:  Not on file   Review of Systems No known ill exposures No change in smell or taste No N/V Eating okay    Objective:   Physical Exam Constitutional:      Appearance: Normal appearance.  HENT:     Head:     Comments: Mild frontal tenderness    Right Ear: Tympanic membrane and ear canal normal.     Left Ear: Tympanic membrane and ear canal normal.     Mouth/Throat:     Pharynx: No oropharyngeal exudate or posterior oropharyngeal erythema.  Pulmonary:     Effort: Pulmonary effort is normal.     Breath sounds: Normal breath sounds. No wheezing or rales.  Musculoskeletal:     Cervical back: Neck supple.  Lymphadenopathy:     Cervical: No cervical adenopathy.  Neurological:     Mental Status: She is alert.            Assessment & Plan:

## 2023-01-25 NOTE — Telephone Encounter (Signed)
  Chief Complaint: Possible URI/Sinus Infx Symptoms: Cough, HA, congestion, sinus pressure,white sputum Frequency: Ongoing x 5 days Pertinent Negatives: Patient denies fever, N/V Disposition: [] ED /[] Urgent Care (no appt availability in office) / [x] Appointment(In office/virtual)/ []  [facility]/ [] Home Care/ [] Refused Recommended Disposition /[] [facility]/ []  Follow-up with PCP Additional Notes: Pt reports she has been experiencing productive cough with white sputum, sinus pressure, congestion x 5 days. She denies fever, N/V or SOB. She reports the cough has lessened in intensity today but continues to be persistent. OV scheduled today PM. This RN educated pt on home care, new-worsening symptoms, when to call back. Pt verbalized understanding and agrees to plan.   Copied from CRM [id number]. Topic: Clinical - Red Word Triage >> Jan 25, 2023 10:54 AM Steele Sizer wrote: Red Word that prompted transfer to Nurse Triage: Pt stated that she feels like she has a sinus infection, cold, chest pain, dizziness and has been going on since Sunday. Reason for Disposition  [1] Continuous (nonstop) coughing interferes with work or school AND [2] no improvement using cough treatment per Care Advice  Answer Assessment - Initial Assessment Questions 1. ONSET: "When did the cough begin?"      Sunday 2. SEVERITY: "How bad is the cough today?"      Not as bad today as yesterday, still persistent 3. SPUTUM: "Describe the color of your sputum" (none, dry cough; clear, white, yellow, green)     White 4. HEMOPTYSIS: "Are you coughing up any blood?" If so ask: "How much?" (flecks, streaks, tablespoons, etc.)     None 5. DIFFICULTY BREATHING: "Are you having difficulty breathing?" If Yes, ask: "How bad is it?" (e.g., mild, moderate, severe)    - MILD: No SOB at rest, mild SOB with walking, speaks normally in sentences, can lie down, no retractions, pulse < 100.    - MODERATE: SOB at rest, SOB with  minimal exertion and prefers to sit, cannot lie down flat, speaks in phrases, mild retractions, audible wheezing, pulse 100-120.    - SEVERE: Very SOB at rest, speaks in single words, struggling to breathe, sitting hunched forward, retractions, pulse > 120      None 6. FEVER: "Do you have a fever?" If Yes, ask: "What is your temperature, how was it measured, and when did it start?"     Have not checked it  10. OTHER SYMPTOMS: "Do you have any other symptoms?" (e.g., runny nose, wheezing, chest pain)       Runny nose, HA, pressure behind eyes/back of head  Protocols used: Cough - Acute Productive-A-AH

## 2023-01-25 NOTE — Assessment & Plan Note (Signed)
Still could be viral Discussed supportive care Will give written Rx for augmentin 875 bid  7 days----to start if worsening in the next few days

## 2023-09-23 ENCOUNTER — Telehealth: Payer: Self-pay | Admitting: Family Medicine

## 2023-09-23 DIAGNOSIS — E7800 Pure hypercholesterolemia, unspecified: Secondary | ICD-10-CM

## 2023-09-23 DIAGNOSIS — Z0000 Encounter for general adult medical examination without abnormal findings: Secondary | ICD-10-CM

## 2023-09-23 DIAGNOSIS — E559 Vitamin D deficiency, unspecified: Secondary | ICD-10-CM

## 2023-09-23 NOTE — Telephone Encounter (Signed)
-----   Message from Veva JINNY Ferrari sent at 09/13/2023  9:52 AM EDT ----- Regarding: Lab orders for Mon, 9.15.25 Patient is scheduled for CPX labs, please order future labs, Thanks , Veva

## 2023-09-24 ENCOUNTER — Other Ambulatory Visit (INDEPENDENT_AMBULATORY_CARE_PROVIDER_SITE_OTHER): Payer: 59

## 2023-09-24 ENCOUNTER — Ambulatory Visit: Payer: Self-pay | Admitting: Family Medicine

## 2023-09-24 DIAGNOSIS — E7800 Pure hypercholesterolemia, unspecified: Secondary | ICD-10-CM | POA: Diagnosis not present

## 2023-09-24 DIAGNOSIS — Z0000 Encounter for general adult medical examination without abnormal findings: Secondary | ICD-10-CM | POA: Diagnosis not present

## 2023-09-24 DIAGNOSIS — E559 Vitamin D deficiency, unspecified: Secondary | ICD-10-CM

## 2023-09-24 LAB — CBC WITH DIFFERENTIAL/PLATELET
Basophils Absolute: 0 K/uL (ref 0.0–0.1)
Basophils Relative: 0.6 % (ref 0.0–3.0)
Eosinophils Absolute: 0.1 K/uL (ref 0.0–0.7)
Eosinophils Relative: 2.4 % (ref 0.0–5.0)
HCT: 40.8 % (ref 36.0–46.0)
Hemoglobin: 13.7 g/dL (ref 12.0–15.0)
Lymphocytes Relative: 16.3 % (ref 12.0–46.0)
Lymphs Abs: 0.9 K/uL (ref 0.7–4.0)
MCHC: 33.6 g/dL (ref 30.0–36.0)
MCV: 95.6 fl (ref 78.0–100.0)
Monocytes Absolute: 0.2 K/uL (ref 0.1–1.0)
Monocytes Relative: 4.2 % (ref 3.0–12.0)
Neutro Abs: 4.2 K/uL (ref 1.4–7.7)
Neutrophils Relative %: 76.5 % (ref 43.0–77.0)
Platelets: 193 K/uL (ref 150.0–400.0)
RBC: 4.26 Mil/uL (ref 3.87–5.11)
RDW: 12.2 % (ref 11.5–15.5)
WBC: 5.5 K/uL (ref 4.0–10.5)

## 2023-09-24 LAB — COMPREHENSIVE METABOLIC PANEL WITH GFR
ALT: 15 U/L (ref 0–35)
AST: 17 U/L (ref 0–37)
Albumin: 4.8 g/dL (ref 3.5–5.2)
Alkaline Phosphatase: 41 U/L (ref 39–117)
BUN: 19 mg/dL (ref 6–23)
CO2: 30 meq/L (ref 19–32)
Calcium: 9.4 mg/dL (ref 8.4–10.5)
Chloride: 103 meq/L (ref 96–112)
Creatinine, Ser: 0.81 mg/dL (ref 0.40–1.20)
GFR: 81.53 mL/min (ref >60.00)
Glucose, Bld: 103 mg/dL — ABNORMAL HIGH (ref 70–99)
Potassium: 4.1 meq/L (ref 3.5–5.1)
Sodium: 139 meq/L (ref 135–145)
Total Bilirubin: 0.4 mg/dL (ref 0.2–1.2)
Total Protein: 7 g/dL (ref 6.0–8.3)

## 2023-09-24 LAB — LIPID PANEL
Cholesterol: 203 mg/dL — ABNORMAL HIGH (ref 0–200)
HDL: 72.1 mg/dL (ref >39.00)
LDL Cholesterol: 121 mg/dL — ABNORMAL HIGH (ref 0–99)
NonHDL: 130.72
Total CHOL/HDL Ratio: 3
Triglycerides: 51 mg/dL (ref 0.0–149.0)
VLDL: 10.2 mg/dL (ref 0.0–40.0)

## 2023-09-24 LAB — TSH: TSH: 3.67 u[IU]/mL (ref 0.35–5.50)

## 2023-09-24 LAB — VITAMIN D 25 HYDROXY (VIT D DEFICIENCY, FRACTURES): VITD: 85.68 ng/mL (ref 30.00–100.00)

## 2023-10-01 ENCOUNTER — Encounter: Payer: Self-pay | Admitting: Family Medicine

## 2023-10-01 ENCOUNTER — Ambulatory Visit (INDEPENDENT_AMBULATORY_CARE_PROVIDER_SITE_OTHER): Payer: 59 | Admitting: Family Medicine

## 2023-10-01 VITALS — BP 120/78 | HR 66 | Temp 97.5°F | Ht 63.15 in | Wt 146.4 lb

## 2023-10-01 DIAGNOSIS — E2839 Other primary ovarian failure: Secondary | ICD-10-CM

## 2023-10-01 DIAGNOSIS — E559 Vitamin D deficiency, unspecified: Secondary | ICD-10-CM

## 2023-10-01 DIAGNOSIS — E7800 Pure hypercholesterolemia, unspecified: Secondary | ICD-10-CM | POA: Diagnosis not present

## 2023-10-01 DIAGNOSIS — L700 Acne vulgaris: Secondary | ICD-10-CM

## 2023-10-01 DIAGNOSIS — Z0000 Encounter for general adult medical examination without abnormal findings: Secondary | ICD-10-CM | POA: Diagnosis not present

## 2023-10-01 DIAGNOSIS — Z8262 Family history of osteoporosis: Secondary | ICD-10-CM | POA: Diagnosis not present

## 2023-10-01 NOTE — Assessment & Plan Note (Signed)
 Insurance did not cover dexa  Pt is looking for places to have it done out of pocket and will let us  know  On HRT from gyn

## 2023-10-01 NOTE — Assessment & Plan Note (Signed)
 Insurance will not cover dexa  Pt is looking for places to have it out of pocket

## 2023-10-01 NOTE — Assessment & Plan Note (Signed)
 Still struggling/ suspect hormonal  Accuatane in past Will increase spironolactone  from 25 to 50 mg daily   Check lab 2 weeks for bmet  Continue current products

## 2023-10-01 NOTE — Progress Notes (Signed)
 Subjective:    Patient ID: Jill Gay, female    DOB: 1965/10/03, 58 y.o.   MRN: [id number]  HPI  Here for health maintenance exam and to review chronic medical problems   Wt Readings from Last 3 Encounters:  10/01/23 146 lb 6.4 oz (66.4 kg)  01/25/23 144 lb (65.3 kg)  01/15/23 141 lb (64 kg)   25.81 kg/m  Vitals:   10/01/23 0922  BP: 120/78  Pulse: 66  Temp: (!) 97.5 F (36.4 C)  SpO2: 96%    Immunization History  Administered Date(s) Administered   Influenza,inj,Quad PF,6+ Mos 10/08/2013, 10/21/2015, 11/06/2016, 11/08/2017, 11/11/2018, 11/13/2019   Influenza-Unspecified 10/03/2014   Tdap 11/16/2010, 08/30/2021    Health Maintenance Due  Topic Date Due   Hepatitis B Vaccines 19-59 Average Risk (1 of 3 - 19+ 3-dose series) Never done   Pneumococcal Vaccine: 50+ Years (1 of 1 - PCV) Never done   Zoster Vaccines- Shingrix (1 of 2) Never done   Mammogram  04/07/2022   COVID-19 Vaccine (1 - 2024-25 season) Never done    Flu shot - declines  Shingrix (has had shingles)- declines for now   Mammogram- at gyn 11/2022  Self breast exam  Gyn health Sees gynecologist -[facility]  Pap 11/2021  On HRT Est patch 0025  Progesterone 100 mg daily   (on compounded version of it now)     Colon cancer screening  Cologuard 12/2019  Colonoscopy 01/2023 - 10 y recall   Bone health  Family history of osteoporosis  Dexa - insurance will not cover it (we tried to order)  Karolynn Angst had spinal fractures in past  No new fractures  Supplements -vitamin D   Last vitamin D  Lab Results  Component Value Date   VD25OH 85.68 09/24/2023    Exercise  Lifts weights daily  Walks 5 miles every 3 days    Mood    10/01/2023   10:04 AM 09/25/2022    9:58 AM 08/30/2021    8:20 AM 09/01/2020    8:46 AM 11/13/2019    8:08 AM  Depression screen PHQ 2/9  Decreased Interest 0 0 0 0 0  Down, Depressed, Hopeless 0 0 0 0 0  PHQ - 2 Score 0 0 0 0 0  Altered  sleeping 0 0  0   Tired, decreased energy 0 0  0   Change in appetite 1 0  0   Feeling bad or failure about yourself  0 0  0   Trouble concentrating 0 0  0   Moving slowly or fidgety/restless 0 0  0   Suicidal thoughts 0 0  0   PHQ-9 Score 1 0  0   Difficult doing work/chores Not difficult at all Not difficult at all  Not difficult at all     Hyperlipidemia Lab Results  Component Value Date   CHOL 203 (H) 09/24/2023   CHOL 203 (H) 09/20/2022   CHOL 206 (H) 11/03/2021   Lab Results  Component Value Date   HDL 72.10 09/24/2023   HDL 79.80 09/20/2022   HDL 71.20 11/03/2021   Lab Results  Component Value Date   LDLCALC 121 (H) 09/24/2023   LDLCALC 116 (H) 09/20/2022   LDLCALC 123 (H) 11/03/2021   Lab Results  Component Value Date   TRIG 51.0 09/24/2023   TRIG 39.0 09/20/2022   TRIG 57.0 11/03/2021   Lab Results  Component Value Date   CHOLHDL 3 09/24/2023   CHOLHDL 3 09/20/2022  CHOLHDL 3 11/03/2021   No results found for: LDLDIRECT  The 10-year ASCVD risk score (Arnett DK, et al., 2019) is: 1.4%   Values used to calculate the score:     Age: 78 years     Clincally relevant sex: Female     Is Non-Hispanic African American: No     Diabetic: No     Tobacco smoker: No     Systolic Blood Pressure: 120 mmHg     Is BP treated: No     HDL Cholesterol: 72.1 mg/dL     Total Cholesterol: 203 mg/dL    Takes spiolactone for cystic acne  Lab Results  Component Value Date   NA 139 09/24/2023   K 4.1 09/24/2023   CO2 30 09/24/2023   GLUCOSE 103 (H) 09/24/2023   BUN 19 09/24/2023   CREATININE 0.81 09/24/2023   CALCIUM 9.4 09/24/2023   GFR 81.53 09/24/2023   GFRNONAA 84.20 07/15/2008    Eats well  She only has added sugars once per week (Sundays)   Other labs Lab Results  Component Value Date   WBC 5.5 09/24/2023   HGB 13.7 09/24/2023   HCT 40.8 09/24/2023   MCV 95.6 09/24/2023   PLT 193.0 09/24/2023   Lab Results  Component Value Date   ALT 15  09/24/2023   AST 17 09/24/2023   ALKPHOS 41 09/24/2023   BILITOT 0.4 09/24/2023    Lab Results  Component Value Date   TSH 3.67 09/24/2023     Patient Active Problem List   Diagnosis Date Noted   Abnormal uterine bleeding 12/18/2022   Estrogen deficiency 09/25/2022   Vitamin D  deficiency 09/25/2022   Colon cancer screening 09/25/2022   Hyperlipidemia 11/02/2021   Cold intolerance 08/30/2021   Family history of osteoporosis 08/30/2021   Adverse drug effect 09/01/2020   Cystic acne 11/13/2019   Menopausal symptom 12/28/2016   Routine general medical examination at a health care facility 10/09/2013   Past Medical History:  Diagnosis Date   CHICKENPOX, HX OF    Gestational hypertension    PUD (peptic ulcer disease)    VENEREAL WART    Past Surgical History:  Procedure Laterality Date   BACK SURGERY  02/2015   PELVIC TUMOR REMOVED  2006   Social History   Tobacco Use   Smoking status: Former   Smokeless tobacco: Never   Tobacco comments:    quit in 2001  Vaping Use   Vaping status: Never Used  Substance Use Topics   Alcohol use: Not Currently   Drug use: No   Family History  Problem Relation Age of Onset   Alcohol abuse Mother    Arthritis Mother    Depression Mother    Melanoma Sister    Alcohol abuse Maternal Grandmother    Arthritis Maternal Grandmother    Heart disease Maternal Grandmother    Stroke Maternal Grandmother    Colon cancer Neg Hx    Colon polyps Neg Hx    Allergies  Allergen Reactions   Bactrim  [Sulfamethoxazole -Trimethoprim ] Swelling    Blisters, erythema to skin   Doxycycline  Hives    Joint pains   Current Outpatient Medications on File Prior to Visit  Medication Sig Dispense Refill   ascorbic acid (VITAMIN C) 500 MG tablet Take 500 mg by mouth daily.     CALCIUM GLUCONATE PO Take 500 mg by mouth daily.     estradiol (VIVELLE-DOT) 0.025 MG/24HR APPLY 1 PATCH TWICE A WEEK TOPICALLY AS DIRECTED FOR 90  DAYS     Magnesium 250 MG  CAPS Take by mouth.     Magnesium 500 MG CAPS Take 1 capsule by mouth at bedtime.     Omega-3 Fatty Acids (FISH OIL) 875 MG CAPS Take 1 capsule by mouth daily.     progesterone (PROMETRIUM) 100 MG capsule Take by mouth.     spironolactone  (ALDACTONE ) 25 MG tablet Take 1 tablet (25 mg total) by mouth daily. (Patient taking differently: Take 50 mg by mouth daily.) 90 tablet 3   VITAMIN D  PO Take 5,000 Units by mouth daily.     Current Facility-Administered Medications on File Prior to Visit  Medication Dose Route Frequency Provider Last Rate Last Admin   0.9 %  sodium chloride  infusion  500 mL Intravenous Once Danis, Henry L III, MD        Review of Systems  Constitutional:  Negative for activity change, appetite change, fatigue, fever and unexpected weight change.  HENT:  Negative for congestion, ear pain, rhinorrhea, sinus pressure and sore throat.   Eyes:  Negative for pain, redness and visual disturbance.  Respiratory:  Negative for cough, shortness of breath and wheezing.   Cardiovascular:  Negative for chest pain and palpitations.  Gastrointestinal:  Negative for abdominal pain, blood in stool, constipation and diarrhea.  Endocrine: Negative for polydipsia and polyuria.  Genitourinary:  Negative for dysuria, frequency and urgency.  Musculoskeletal:  Negative for arthralgias, back pain and myalgias.  Skin:  Negative for pallor and rash.  Allergic/Immunologic: Negative for environmental allergies.  Neurological:  Negative for dizziness, syncope and headaches.  Hematological:  Negative for adenopathy. Does not bruise/bleed easily.  Psychiatric/Behavioral:  Negative for decreased concentration and dysphoric mood. The patient is not nervous/anxious.        Objective:   Physical Exam Constitutional:      General: She is not in acute distress.    Appearance: Normal appearance. She is well-developed and normal weight. She is not ill-appearing or diaphoretic.  HENT:     Head:  Normocephalic and atraumatic.     Right Ear: Tympanic membrane, ear canal and external ear normal.     Left Ear: Tympanic membrane, ear canal and external ear normal.     Nose: Nose normal. No congestion.     Mouth/Throat:     Mouth: Mucous membranes are moist.     Pharynx: Oropharynx is clear. No posterior oropharyngeal erythema.  Eyes:     General: No scleral icterus.    Extraocular Movements: Extraocular movements intact.     Conjunctiva/sclera: Conjunctivae normal.     Pupils: Pupils are equal, round, and reactive to light.  Neck:     Thyroid : No thyromegaly.     Vascular: No carotid bruit or JVD.  Cardiovascular:     Rate and Rhythm: Normal rate and regular rhythm.     Pulses: Normal pulses.     Heart sounds: Normal heart sounds.     No gallop.  Pulmonary:     Effort: Pulmonary effort is normal. No respiratory distress.     Breath sounds: Normal breath sounds. No wheezing.     Comments: Good air exch Chest:     Chest wall: No tenderness.  Abdominal:     General: Bowel sounds are normal. There is no distension or abdominal bruit.     Palpations: Abdomen is soft. There is no mass.     Tenderness: There is no abdominal tenderness.     Hernia: No hernia is present.  Genitourinary:  Comments: Breast and pelvic exam are done by gyn provider     Musculoskeletal:        General: No tenderness. Normal range of motion.     Cervical back: Normal range of motion and neck supple. No rigidity. No muscular tenderness.     Right lower leg: No edema.     Left lower leg: No edema.     Comments: No kyphosis   Lymphadenopathy:     Cervical: No cervical adenopathy.  Skin:    General: Skin is warm and dry.     Coloration: Skin is not pale.     Findings: No erythema or rash.     Comments: Solar lentigines diffusely  Acne noted on chin   Neurological:     Mental Status: She is alert. Mental status is at baseline.     Cranial Nerves: No cranial nerve deficit.     Motor: No abnormal  muscle tone.     Coordination: Coordination normal.     Gait: Gait normal.     Deep Tendon Reflexes: Reflexes are normal and symmetric. Reflexes normal.  Psychiatric:        Mood and Affect: Mood normal.        Cognition and Memory: Cognition and memory normal.           Assessment & Plan:   Problem List Items Addressed This Visit       Musculoskeletal and Integument   Cystic acne   Still struggling/ suspect hormonal  Accuatane in past Will increase spironolactone  from 25 to 50 mg daily   Check lab 2 weeks for bmet  Continue current products         Other   Vitamin D  deficiency   Last vitamin D  Lab Results  Component Value Date   VD25OH 85.68 09/24/2023   Vitamin D  level is therapeutic with current supplementation Disc importance of this to bone and overall health       Routine general medical examination at a health care facility - Primary   Reviewed health habits including diet and exercise and skin cancer prevention Reviewed appropriate screening tests for age  Also reviewed health mt list, fam hx and immunization status , as well as social and family history   See HPI Labs reviewed and ordered Health Maintenance  Topic Date Due   Hepatitis B Vaccine (1 of 3 - 19+ 3-dose series) Never done   Pneumococcal Vaccine for age over 32 (1 of 1 - PCV) Never done   Zoster (Shingles) Vaccine (1 of 2) Never done   Breast Cancer Screening  04/07/2022   COVID-19 Vaccine (1 - 2024-25 season) Never done   Flu Shot  04/08/2024*   Pap with HPV screening  12/04/2026   DTaP/Tdap/Td vaccine (3 - Td or Tdap) 08/31/2031   Hepatitis C Screening  Completed   HIV Screening  Completed   HPV Vaccine  Aged Out   Meningitis B Vaccine  Aged Out   Colon Cancer Screening  Discontinued   Cologuard (Stool DNA test)  Discontinued  *Topic was postponed. The date shown is not the original due date.    Declines flu shot May consider shingris in future Sent for most recent mammo from  gyn  Discussed fall prevention, supplements and exercise for bone density   Insurance will not cover dexa  Commended exercise habits PHQ score of 1       Hyperlipidemia   Disc goals for lipids and reasons to control them Rev  last labs with pt Rev low sat fat diet in detail  Rato of 3 Stable Very good HDL LDL of 121 Risk ASCVD score 1.4%      Family history of osteoporosis   Insurance will not cover dexa  Pt is looking for places to have it out of pocket       Estrogen deficiency   Insurance did not cover dexa  Pt is looking for places to have it done out of pocket and will let us  know  On HRT from gyn

## 2023-10-01 NOTE — Assessment & Plan Note (Signed)
 Disc goals for lipids and reasons to control them Rev last labs with pt Rev low sat fat diet in detail  Rato of 3 Stable Very good HDL LDL of 121 Risk ASCVD score 1.4%

## 2023-10-01 NOTE — Assessment & Plan Note (Signed)
 Reviewed health habits including diet and exercise and skin cancer prevention Reviewed appropriate screening tests for age  Also reviewed health mt list, fam hx and immunization status , as well as social and family history   See HPI Labs reviewed and ordered Health Maintenance  Topic Date Due   Hepatitis B Vaccine (1 of 3 - 19+ 3-dose series) Never done   Pneumococcal Vaccine for age over 47 (1 of 1 - PCV) Never done   Zoster (Shingles) Vaccine (1 of 2) Never done   Breast Cancer Screening  04/07/2022   COVID-19 Vaccine (1 - 2024-25 season) Never done   Flu Shot  04/08/2024*   Pap with HPV screening  12/04/2026   DTaP/Tdap/Td vaccine (3 - Td or Tdap) 08/31/2031   Hepatitis C Screening  Completed   HIV Screening  Completed   HPV Vaccine  Aged Out   Meningitis B Vaccine  Aged Out   Colon Cancer Screening  Discontinued   Cologuard (Stool DNA test)  Discontinued  *Topic was postponed. The date shown is not the original due date.    Declines flu shot May consider shingris in future Sent for most recent mammo from gyn  Discussed fall prevention, supplements and exercise for bone density   Insurance will not cover dexa  Commended exercise habits PHQ score of 1

## 2023-10-01 NOTE — Patient Instructions (Addendum)
 If you are interested in the shingles vaccine series (Shingrix), call your insurance or pharmacy to check on coverage and location it must be given.  If affordable - you can schedule it here or at your pharmacy depending on coverage   Go up to 50 mg daily on spironolactone  for the acne We will check labs in about 2 weeks    Keep up the good work with diet and exercise

## 2023-10-01 NOTE — Assessment & Plan Note (Signed)
 Last vitamin D  Lab Results  Component Value Date   VD25OH 85.68 09/24/2023   Vitamin D  level is therapeutic with current supplementation Disc importance of this to bone and overall health

## 2023-10-16 ENCOUNTER — Telehealth: Payer: Self-pay | Admitting: Family Medicine

## 2023-10-16 DIAGNOSIS — T50905S Adverse effect of unspecified drugs, medicaments and biological substances, sequela: Secondary | ICD-10-CM

## 2023-10-16 DIAGNOSIS — L700 Acne vulgaris: Secondary | ICD-10-CM

## 2023-10-16 NOTE — Telephone Encounter (Signed)
-----   Message from Harlene Du sent at 10/11/2023  1:44 PM EDT ----- Regarding: Lab Thurs 10/18/23 Hello,   Patient has a lab appointment on Thursday 10/18/23 for a BMP. Can we get lab orders please.   Thanks

## 2023-10-18 ENCOUNTER — Other Ambulatory Visit (INDEPENDENT_AMBULATORY_CARE_PROVIDER_SITE_OTHER)

## 2023-10-18 ENCOUNTER — Ambulatory Visit: Payer: Self-pay | Admitting: Family Medicine

## 2023-10-18 ENCOUNTER — Other Ambulatory Visit

## 2023-10-18 DIAGNOSIS — T50905S Adverse effect of unspecified drugs, medicaments and biological substances, sequela: Secondary | ICD-10-CM

## 2023-10-18 DIAGNOSIS — L700 Acne vulgaris: Secondary | ICD-10-CM | POA: Diagnosis not present

## 2023-10-18 DIAGNOSIS — T50905A Adverse effect of unspecified drugs, medicaments and biological substances, initial encounter: Secondary | ICD-10-CM

## 2023-10-18 DIAGNOSIS — E871 Hypo-osmolality and hyponatremia: Secondary | ICD-10-CM | POA: Insufficient documentation

## 2023-10-18 LAB — BASIC METABOLIC PANEL WITH GFR
BUN: 16 mg/dL (ref 6–23)
CO2: 25 meq/L (ref 19–32)
Calcium: 9 mg/dL (ref 8.4–10.5)
Chloride: 100 meq/L (ref 96–112)
Creatinine, Ser: 0.77 mg/dL (ref 0.40–1.20)
GFR: 86.6 mL/min (ref >60.00)
Glucose, Bld: 107 mg/dL — ABNORMAL HIGH (ref 70–99)
Potassium: 3.8 meq/L (ref 3.5–5.1)
Sodium: 133 meq/L — ABNORMAL LOW (ref 135–145)

## 2023-10-18 MED ORDER — SPIRONOLACTONE 50 MG PO TABS: 50.0000 mg | ORAL_TABLET | Freq: Every day | ORAL | 2 refills | Status: AC

## 2023-10-19 ENCOUNTER — Telehealth: Payer: Self-pay | Admitting: *Deleted

## 2023-10-19 NOTE — Telephone Encounter (Signed)
 Pt viewed labs via mychart.per Dr. Randeen pt needs a bmet lab in one month. Please schedule non fasting lab appt in one month thanks

## 2023-10-23 ENCOUNTER — Ambulatory Visit (INDEPENDENT_AMBULATORY_CARE_PROVIDER_SITE_OTHER): Admitting: Family Medicine

## 2023-10-23 ENCOUNTER — Encounter: Payer: Self-pay | Admitting: Family Medicine

## 2023-10-23 VITALS — BP 126/78 | HR 77 | Temp 98.3°F | Ht 63.25 in | Wt 144.1 lb

## 2023-10-23 DIAGNOSIS — H6690 Otitis media, unspecified, unspecified ear: Secondary | ICD-10-CM | POA: Insufficient documentation

## 2023-10-23 DIAGNOSIS — H6503 Acute serous otitis media, bilateral: Secondary | ICD-10-CM

## 2023-10-23 MED ORDER — AMOXICILLIN-POT CLAVULANATE 875-125 MG PO TABS: 1.0000 | ORAL_TABLET | Freq: Two times a day (BID) | ORAL | 0 refills | Status: AC

## 2023-10-23 NOTE — Progress Notes (Signed)
 Subjective:    Patient ID: Jill Gay, female    DOB: 15-Jul-1965, 58 y.o.   MRN: [id number]  HPI  Wt Readings from Last 3 Encounters:  10/23/23 144 lb 2 oz (65.4 kg)  10/01/23 146 lb 6.4 oz (66.4 kg)  01/25/23 144 lb (65.3 kg)   25.33 kg/m  Vitals:   10/23/23 1156  BP: 126/78  Pulse: 77  Temp: 98.3 F (36.8 C)  SpO2: 100%    Pt presents with  Ear fullness /hearing change  Ringing in ears   Started in left ear  Now worse in the right   Last week had a cold  Took expectorant  Some pain above eyes and r cheek  Phlegm is light brown to green  Some cough   No wheeze or shortness of breath   Did not do a covid test   Over the counter Day quil and ny quil     Patient Active Problem List   Diagnosis Date Noted   Otitis media 10/23/2023   Hyponatremia 10/18/2023   Abnormal uterine bleeding 12/18/2022   Estrogen deficiency 09/25/2022   Vitamin D  deficiency 09/25/2022   Colon cancer screening 09/25/2022   Hyperlipidemia 11/02/2021   Cold intolerance 08/30/2021   Family history of osteoporosis 08/30/2021   Adverse drug effect 09/01/2020   Cystic acne 11/13/2019   Menopausal symptom 12/28/2016   Routine general medical examination at a health care facility 10/09/2013   Past Medical History:  Diagnosis Date   Allergy    Anxiety    CHICKENPOX, HX OF    Gestational hypertension    PUD (peptic ulcer disease)    VENEREAL WART    Past Surgical History:  Procedure Laterality Date   BACK SURGERY  02/2015   PELVIC TUMOR REMOVED  2006   SPINE SURGERY  January 2017   Social History   Tobacco Use   Smoking status: Former   Smokeless tobacco: Never   Tobacco comments:    quit in 2001  Vaping Use   Vaping status: Never Used  Substance Use Topics   Alcohol use: Not Currently   Drug use: No   Family History  Problem Relation Age of Onset   Alcohol abuse Mother    Arthritis Mother    Depression Mother    Melanoma Sister    Alcohol abuse Maternal  Grandmother    Arthritis Maternal Grandmother    Heart disease Maternal Grandmother    Stroke Maternal Grandmother    Colon cancer Neg Hx    Colon polyps Neg Hx    Allergies  Allergen Reactions   Bactrim  [Sulfamethoxazole -Trimethoprim ] Swelling    Blisters, erythema to skin   Doxycycline  Hives    Joint pains   Current Outpatient Medications on File Prior to Visit  Medication Sig Dispense Refill   ascorbic acid (VITAMIN C) 500 MG tablet Take 500 mg by mouth daily.     CALCIUM GLUCONATE PO Take 500 mg by mouth daily.     estradiol (VIVELLE-DOT) 0.05 MG/24HR patch Place 1 patch onto the skin 2 (two) times a week.     Magnesium 250 MG CAPS Take by mouth.     Magnesium 500 MG CAPS Take 1 capsule by mouth at bedtime.     Omega-3 Fatty Acids (FISH OIL) 875 MG CAPS Take 1 capsule by mouth daily.     progesterone (PROMETRIUM) 100 MG capsule Take by mouth.     spironolactone  (ALDACTONE ) 50 MG tablet Take 1 tablet (50 mg total)  by mouth daily. 90 tablet 2   VITAMIN D  PO Take 5,000 Units by mouth daily.     Current Facility-Administered Medications on File Prior to Visit  Medication Dose Route Frequency Provider Last Rate Last Admin   0.9 %  sodium chloride  infusion  500 mL Intravenous Once Danis, Victory LITTIE MOULD, MD        Review of Systems  Constitutional:  Negative for fatigue and fever.  HENT:  Positive for congestion, ear pain, hearing loss, postnasal drip, rhinorrhea, sinus pressure and tinnitus. Negative for ear discharge, sneezing and sore throat.        May have had drainage from left ear yesterday  ST is improved   Eyes:  Negative for pain and discharge.  Respiratory:  Positive for cough. Negative for shortness of breath, wheezing and stridor.   Cardiovascular:  Negative for chest pain.  Gastrointestinal:  Negative for diarrhea, nausea and vomiting.  Genitourinary:  Negative for frequency, hematuria and urgency.  Musculoskeletal:  Negative for arthralgias and myalgias.  Skin:   Negative for rash.  Neurological:  Negative for dizziness, weakness, light-headedness and headaches.  Psychiatric/Behavioral:  Negative for confusion and dysphoric mood.        Objective:   Physical Exam Constitutional:      General: She is not in acute distress.    Appearance: Normal appearance. She is well-developed. She is not ill-appearing, toxic-appearing or diaphoretic.  HENT:     Head: Normocephalic and atraumatic.     Comments: Nares are injected and congested      Right Ear: Ear canal and external ear normal.     Left Ear: Tympanic membrane, ear canal and external ear normal.     Ears:     Comments: Right TM- mild erythema and small effusion /dull TM no bulging   Left TM - mod erythema / effusion / dull TM no bulging/slightly retracted        Nose: Congestion and rhinorrhea present.     Mouth/Throat:     Mouth: Mucous membranes are moist.     Pharynx: Oropharynx is clear. No oropharyngeal exudate or posterior oropharyngeal erythema.     Comments: Clear pnd  Eyes:     General:        Right eye: No discharge.        Left eye: No discharge.     Conjunctiva/sclera: Conjunctivae normal.     Pupils: Pupils are equal, round, and reactive to light.  Cardiovascular:     Rate and Rhythm: Normal rate and regular rhythm.     Heart sounds: Normal heart sounds.  Pulmonary:     Effort: Pulmonary effort is normal. No respiratory distress.     Breath sounds: Normal breath sounds. No stridor. No wheezing, rhonchi or rales.     Comments: Good air exch Chest:     Chest wall: No tenderness.  Musculoskeletal:     Cervical back: Normal range of motion and neck supple.  Lymphadenopathy:     Cervical: No cervical adenopathy.  Skin:    General: Skin is warm and dry.     Capillary Refill: Capillary refill takes less than 2 seconds.     Findings: No rash.  Neurological:     Mental Status: She is alert.     Cranial Nerves: No cranial nerve deficit.  Psychiatric:        Mood and  Affect: Mood normal.           Assessment & Plan:  Problem List Items Addressed This Visit       Nervous and Auditory   Otitis media - Primary   Bilateral - after recent viral uri with congestion  Ear pain is improved from several days ago but fullness/ tinnitus and hearing change are bothersome  On exam erythema and dullness/worse on the left  Some sinus pressure /discomfort in right cheek but no tenderness  Prescription augmentin  given for OM and possible early bacterial sinusitis  Recommended flonase ns over the counter for congestion  If not improved-consider prednisone    Has airplane trip tomorrow- can use afrin for lift off if needed   Disc symptomatic care - see instructions on AVS  Update if not starting to improve in a week or if worsening  Call back and Er precautions noted in detail today        Relevant Medications   amoxicillin -clavulanate (AUGMENTIN ) 875-125 MG tablet

## 2023-10-23 NOTE — Patient Instructions (Signed)
 Take the augmentin  as directed   Use afrin nasal spray just prior to lift off on the plane   Get flonase over the counter  Use that every day to help open sinuses and ears   Update if not starting to improve in a week or if worsening

## 2023-10-23 NOTE — Assessment & Plan Note (Signed)
 Bilateral - after recent viral uri with congestion  Ear pain is improved from several days ago but fullness/ tinnitus and hearing change are bothersome  On exam erythema and dullness/worse on the left  Some sinus pressure /discomfort in right cheek but no tenderness  Prescription augmentin  given for OM and possible early bacterial sinusitis  Recommended flonase ns over the counter for congestion  If not improved-consider prednisone    Has airplane trip tomorrow- can use afrin for lift off if needed   Disc symptomatic care - see instructions on AVS  Update if not starting to improve in a week or if worsening  Call back and Er precautions noted in detail today

## 2023-10-30 ENCOUNTER — Encounter: Payer: Self-pay | Admitting: Family Medicine

## 2023-11-01 ENCOUNTER — Encounter: Payer: Self-pay | Admitting: Family Medicine

## 2023-11-19 ENCOUNTER — Other Ambulatory Visit

## 2023-11-19 ENCOUNTER — Ambulatory Visit: Payer: Self-pay | Admitting: Family Medicine

## 2023-11-19 DIAGNOSIS — T50905S Adverse effect of unspecified drugs, medicaments and biological substances, sequela: Secondary | ICD-10-CM

## 2023-11-19 DIAGNOSIS — E871 Hypo-osmolality and hyponatremia: Secondary | ICD-10-CM | POA: Diagnosis not present

## 2023-11-19 LAB — BASIC METABOLIC PANEL WITH GFR
BUN: 12 mg/dL (ref 6–23)
CO2: 28 meq/L (ref 19–32)
Calcium: 9 mg/dL (ref 8.4–10.5)
Chloride: 103 meq/L (ref 96–112)
Creatinine, Ser: 0.91 mg/dL (ref 0.40–1.20)
GFR: 70.82 mL/min (ref >60.00)
Glucose, Bld: 94 mg/dL (ref 70–99)
Potassium: 4.2 meq/L (ref 3.5–5.1)
Sodium: 139 meq/L (ref 135–145)

## 2023-11-19 IMAGING — MG MM DIGITAL SCREENING BILAT W/ TOMO AND CAD
8 of 14 series · 8 of 40 positions shown · non-contrast
Comparison: Previous exam(s).

CLINICAL DATA: Screening.

EXAM:
DIGITAL SCREENING BILATERAL MAMMOGRAM WITH TOMOSYNTHESIS AND CAD
TECHNIQUE: Bilateral screening digital craniocaudal and mediolateral oblique
mammograms were obtained. Bilateral screening digital breast
tomosynthesis was performed. The images were evaluated with
computer-aided detection.

[R MLO synth-2D (1 of 2)]
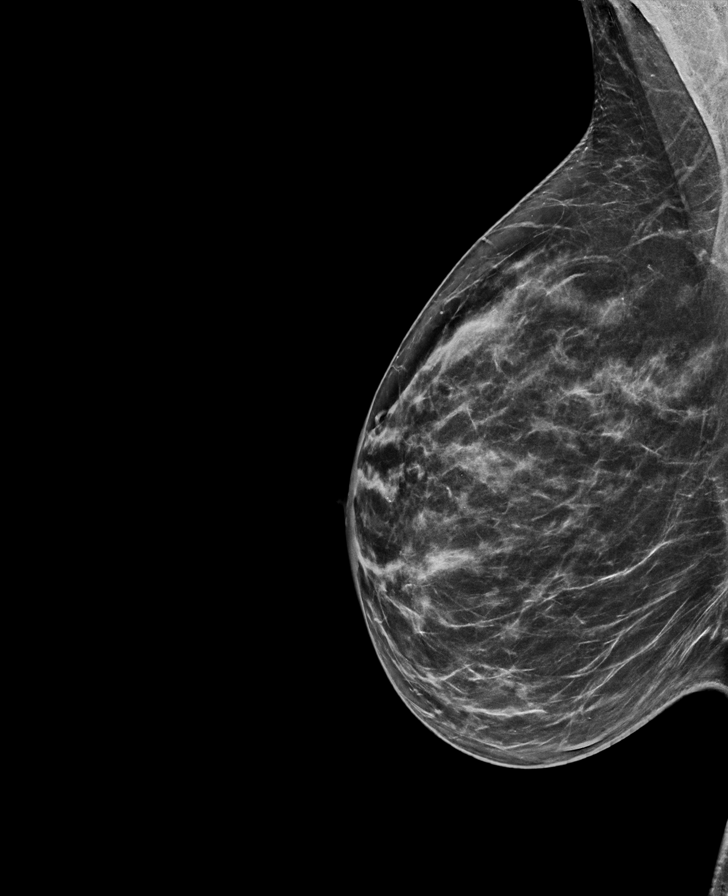

[L MLO synth-2D (1 of 3)]
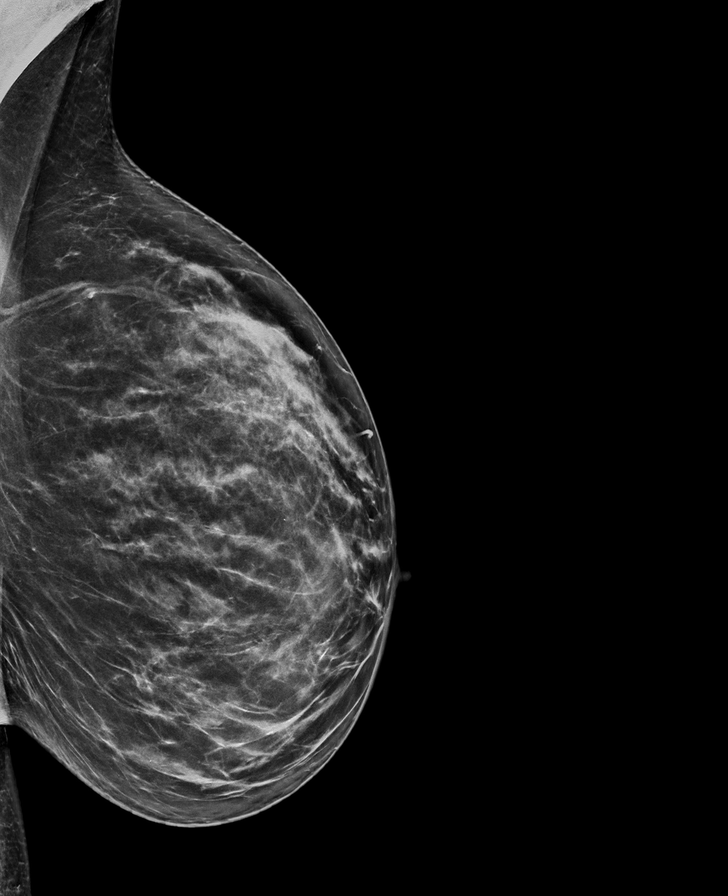

[L MLO synth-2D (2 of 3)]
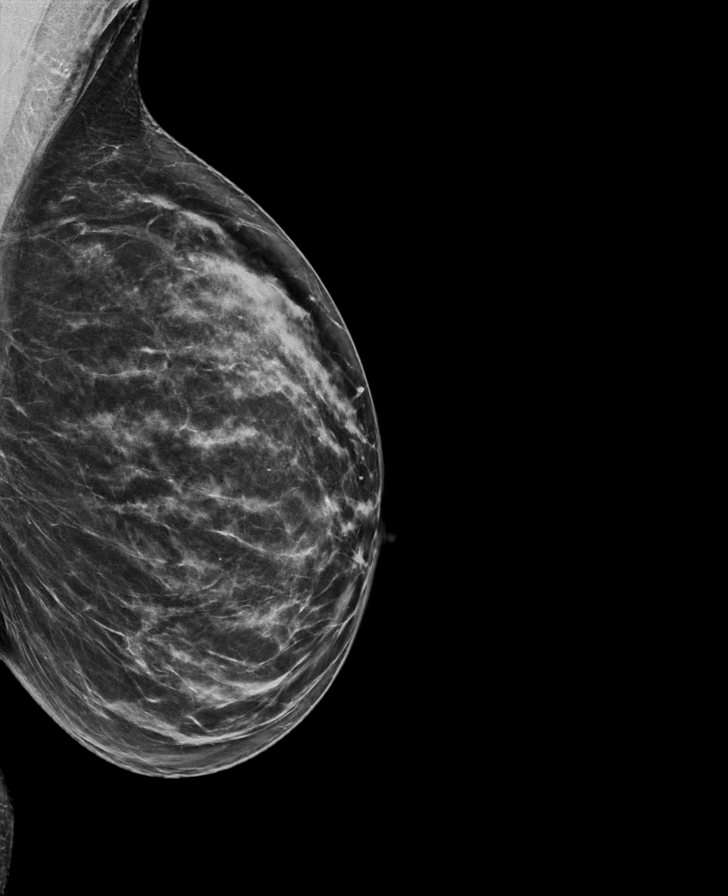

[L CC synth-2D]
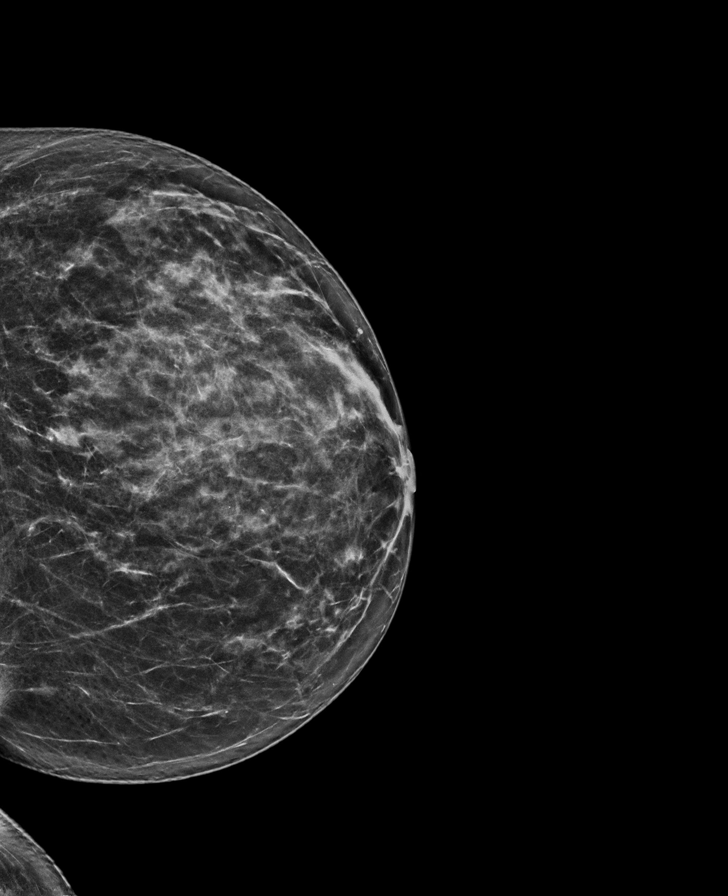

[L MLO synth-2D (3 of 3)]
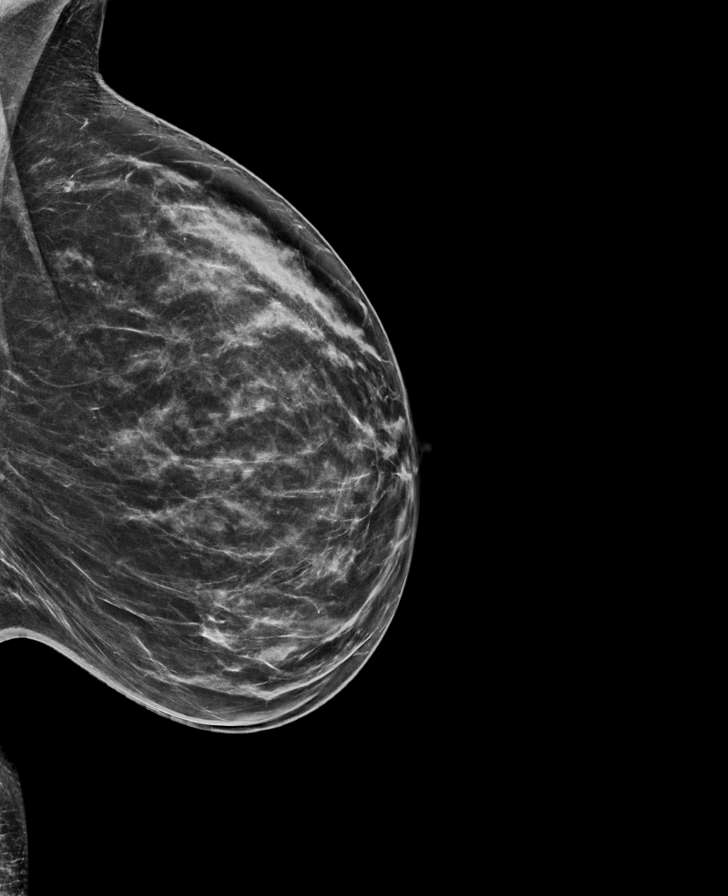

[R CC synth-2D]
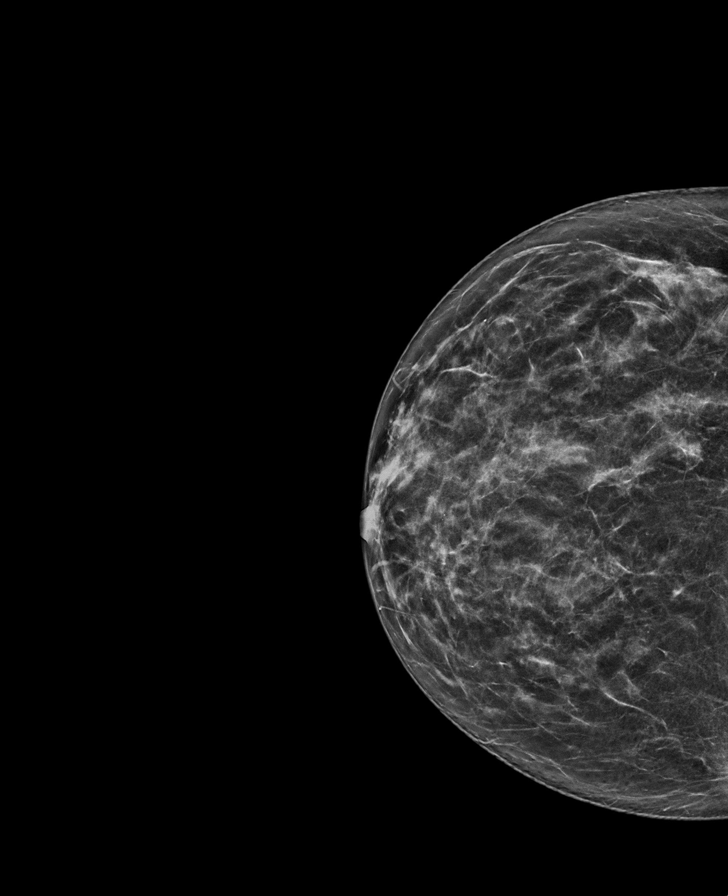

[R MLO synth-2D (2 of 2)]
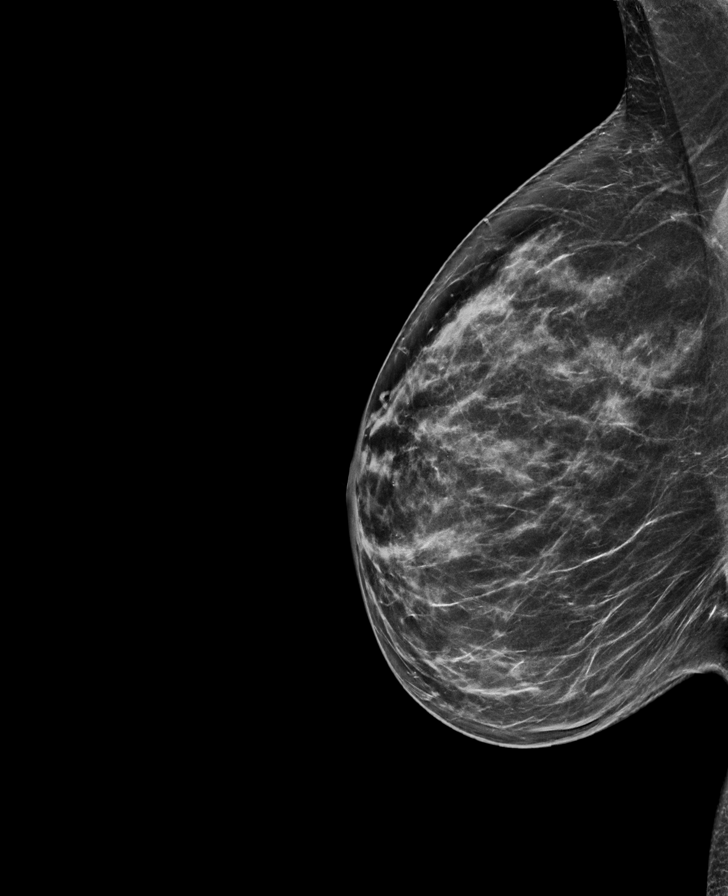

[R CC tomo · tomo slice 35/68.0]
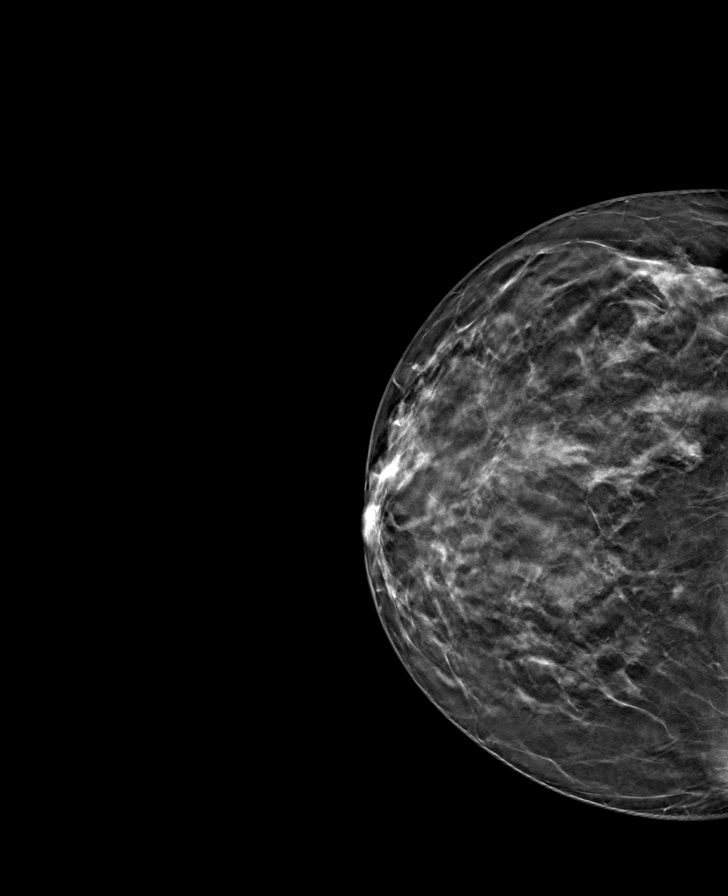

[8 of 40 positions shown; findings below may reference images not displayed]

ACR Breast Density Category c: The breast tissue is heterogeneously
dense, which may obscure small masses.
FINDINGS: There are no findings suspicious for malignancy.
IMPRESSION: No mammographic evidence of malignancy. A result letter of this
screening mammogram will be mailed directly to the patient.

RECOMMENDATION:
Screening mammogram in one year. (Code:Q3-W-BC3)

BI-RADS CATEGORY  1: Negative.
